# Patient Record
Sex: Female | Born: 1980 | Race: Black or African American | Hispanic: No | Marital: Single | Smoking: Never smoker
Health system: Southern US, Community
[De-identification: ages and names within clinical notes are randomized; demographics above are authoritative.]

## PROBLEM LIST (undated history)

## (undated) DIAGNOSIS — F419 Anxiety disorder, unspecified: Secondary | ICD-10-CM

## (undated) DIAGNOSIS — J189 Pneumonia, unspecified organism: Secondary | ICD-10-CM

## (undated) DIAGNOSIS — F32A Depression, unspecified: Secondary | ICD-10-CM

## (undated) DIAGNOSIS — E785 Hyperlipidemia, unspecified: Secondary | ICD-10-CM

## (undated) DIAGNOSIS — E119 Type 2 diabetes mellitus without complications: Secondary | ICD-10-CM

## (undated) DIAGNOSIS — I10 Essential (primary) hypertension: Secondary | ICD-10-CM

## (undated) DIAGNOSIS — F329 Major depressive disorder, single episode, unspecified: Secondary | ICD-10-CM

## (undated) HISTORY — DX: Depression, unspecified: F32.A

## (undated) HISTORY — DX: Type 2 diabetes mellitus without complications: E11.9

## (undated) HISTORY — DX: Hyperlipidemia, unspecified: E78.5

## (undated) HISTORY — PX: UMBILICAL HERNIA REPAIR: SHX196

## (undated) HISTORY — DX: Anxiety disorder, unspecified: F41.9

## (undated) HISTORY — DX: Essential (primary) hypertension: I10

## (undated) HISTORY — DX: Major depressive disorder, single episode, unspecified: F32.9

---

## 2014-09-02 ENCOUNTER — Encounter (HOSPITAL_COMMUNITY): Payer: Self-pay | Admitting: Emergency Medicine

## 2014-09-02 ENCOUNTER — Emergency Department (HOSPITAL_COMMUNITY)
Admission: EM | Admit: 2014-09-02 | Discharge: 2014-09-02 | Disposition: A | Discharge status: Home / Self Care | Payer: Medicaid Other | Attending: Emergency Medicine | Admitting: Emergency Medicine

## 2014-09-02 DIAGNOSIS — R51 Headache: Secondary | ICD-10-CM | POA: Diagnosis not present

## 2014-09-02 DIAGNOSIS — R42 Dizziness and giddiness: Secondary | ICD-10-CM | POA: Insufficient documentation

## 2014-09-02 DIAGNOSIS — R102 Pelvic and perineal pain: Secondary | ICD-10-CM | POA: Insufficient documentation

## 2014-09-02 DIAGNOSIS — Z3202 Encounter for pregnancy test, result negative: Secondary | ICD-10-CM | POA: Insufficient documentation

## 2014-09-02 DIAGNOSIS — R531 Weakness: Secondary | ICD-10-CM

## 2014-09-02 DIAGNOSIS — R110 Nausea: Secondary | ICD-10-CM | POA: Diagnosis not present

## 2014-09-02 DIAGNOSIS — R5383 Other fatigue: Secondary | ICD-10-CM | POA: Insufficient documentation

## 2014-09-02 DIAGNOSIS — Z880 Allergy status to penicillin: Secondary | ICD-10-CM | POA: Insufficient documentation

## 2014-09-02 LAB — COMPREHENSIVE METABOLIC PANEL
ALT: 14 U/L (ref 0–35)
AST: 16 U/L (ref 0–37)
Albumin: 3.8 g/dL (ref 3.5–5.2)
Alkaline Phosphatase: 74 U/L (ref 39–117)
Anion gap: 12 (ref 5–15)
BUN: 12 mg/dL (ref 6–23)
CALCIUM: 9.8 mg/dL (ref 8.4–10.5)
CHLORIDE: 99 meq/L (ref 96–112)
CO2: 27 mEq/L (ref 19–32)
Creatinine, Ser: 0.98 mg/dL (ref 0.50–1.10)
GFR calc Af Amer: 88 mL/min — ABNORMAL LOW (ref >90)
GFR calc non Af Amer: 76 mL/min — ABNORMAL LOW (ref >90)
Glucose, Bld: 133 mg/dL — ABNORMAL HIGH (ref 70–99)
Potassium: 3.5 mEq/L — ABNORMAL LOW (ref 3.7–5.3)
SODIUM: 138 meq/L (ref 137–147)
Total Protein: 7.8 g/dL (ref 6.0–8.3)

## 2014-09-02 LAB — URINALYSIS, ROUTINE W REFLEX MICROSCOPIC
Bilirubin Urine: NEGATIVE
Glucose, UA: NEGATIVE mg/dL
Hgb urine dipstick: NEGATIVE
Ketones, ur: NEGATIVE mg/dL
Leukocytes, UA: NEGATIVE
NITRITE: NEGATIVE
PH: 5.5 (ref 5.0–8.0)
Protein, ur: NEGATIVE mg/dL
SPECIFIC GRAVITY, URINE: 1.018 (ref 1.005–1.030)
UROBILINOGEN UA: 0.2 mg/dL (ref 0.0–1.0)

## 2014-09-02 LAB — CBC WITH DIFFERENTIAL/PLATELET
BASOS ABS: 0 10*3/uL (ref 0.0–0.1)
BASOS PCT: 0 % (ref 0–1)
Eosinophils Absolute: 0.1 10*3/uL (ref 0.0–0.7)
Eosinophils Relative: 1 % (ref 0–5)
HEMATOCRIT: 37.6 % (ref 36.0–46.0)
Hemoglobin: 12.1 g/dL (ref 12.0–15.0)
LYMPHS PCT: 34 % (ref 12–46)
Lymphs Abs: 2.5 10*3/uL (ref 0.7–4.0)
MCH: 28.2 pg (ref 26.0–34.0)
MCHC: 32.2 g/dL (ref 30.0–36.0)
MCV: 87.6 fL (ref 78.0–100.0)
MONO ABS: 0.4 10*3/uL (ref 0.1–1.0)
Monocytes Relative: 6 % (ref 3–12)
NEUTROS ABS: 4.4 10*3/uL (ref 1.7–7.7)
NEUTROS PCT: 59 % (ref 43–77)
PLATELETS: 297 10*3/uL (ref 150–400)
RBC: 4.29 MIL/uL (ref 3.87–5.11)
RDW: 12.6 % (ref 11.5–15.5)
WBC: 7.5 10*3/uL (ref 4.0–10.5)

## 2014-09-02 LAB — TSH: TSH: 1.94 u[IU]/mL (ref 0.350–4.500)

## 2014-09-02 LAB — POC URINE PREG, ED: Preg Test, Ur: NEGATIVE

## 2014-09-02 LAB — T4, FREE: Free T4: 1.11 ng/dL (ref 0.80–1.80)

## 2014-09-02 LAB — LIPASE, BLOOD: LIPASE: 27 U/L (ref 11–59)

## 2014-09-02 MED ORDER — POTASSIUM CHLORIDE CRYS ER 20 MEQ PO TBCR
40.0000 meq | EXTENDED_RELEASE_TABLET | Freq: Once | ORAL | Status: AC
  Administered 2014-09-02: 40 meq via ORAL
  Filled 2014-09-02: qty 2

## 2014-09-02 NOTE — ED Notes (Signed)
Pt states that she feels extremely fatigued. Went two months without a period and took several pregnancy tests which were negative.  States that her period just started 4 days ago but was having severe abd cramping.  Cramping has subsided.

## 2014-09-02 NOTE — Discharge Instructions (Signed)
Rest, drink plenty of fluids, make sure you eat healthy. Your thyroid function is pending, you will be called if it returns abnormal. Follow-up with primary care doctor for further evaluation and treatment of your symptoms. Return if worsening.   Fatigue Fatigue is a feeling of tiredness, lack of energy, lack of motivation, or feeling tired all the time. Having enough rest, good nutrition, and reducing stress will normally reduce fatigue. Consult your caregiver if it persists. The nature of your fatigue will help your caregiver to find out its cause. The treatment is based on the cause.  CAUSES  There are many causes for fatigue. Most of the time, fatigue can be traced to one or more of your habits or routines. Most causes fit into one or more of three general areas. They are: Lifestyle problems  Sleep disturbances.  Overwork.  Physical exertion.  Unhealthy habits.  Poor eating habits or eating disorders.  Alcohol and/or drug use .  Lack of proper nutrition (malnutrition). Psychological problems  Stress and/or anxiety problems.  Depression.  Grief.  Boredom. Medical Problems or Conditions  Anemia.  Pregnancy.  Thyroid gland problems.  Recovery from major surgery.  Continuous pain.  Emphysema or asthma that is not well controlled  Allergic conditions.  Diabetes.  Infections (such as mononucleosis).  Obesity.  Sleep disorders, such as sleep apnea.  Heart failure or other heart-related problems.  Cancer.  Kidney disease.  Liver disease.  Effects of certain medicines such as antihistamines, cough and cold remedies, prescription pain medicines, heart and blood pressure medicines, drugs used for treatment of cancer, and some antidepressants. SYMPTOMS  The symptoms of fatigue include:   Lack of energy.  Lack of drive (motivation).  Drowsiness.  Feeling of indifference to the surroundings. DIAGNOSIS  The details of how you feel help guide your  caregiver in finding out what is causing the fatigue. You will be asked about your present and past health condition. It is important to review all medicines that you take, including prescription and non-prescription items. A thorough exam will be done. You will be questioned about your feelings, habits, and normal lifestyle. Your caregiver may suggest blood tests, urine tests, or other tests to look for common medical causes of fatigue.  TREATMENT  Fatigue is treated by correcting the underlying cause. For example, if you have continuous pain or depression, treating these causes will improve how you feel. Similarly, adjusting the dose of certain medicines will help in reducing fatigue.  HOME CARE INSTRUCTIONS   Try to get the required amount of good sleep every night.  Eat a healthy and nutritious diet, and drink enough water throughout the day.  Practice ways of relaxing (including yoga or meditation).  Exercise regularly.  Make plans to change situations that cause stress. Act on those plans so that stresses decrease over time. Keep your work and personal routine reasonable.  Avoid street drugs and minimize use of alcohol.  Start taking a daily multivitamin after consulting your caregiver. SEEK MEDICAL CARE IF:   You have persistent tiredness, which cannot be accounted for.  You have fever.  You have unintentional weight loss.  You have headaches.  You have disturbed sleep throughout the night.  You are feeling sad.  You have constipation.  You have dry skin.  You have gained weight.  You are taking any new or different medicines that you suspect are causing fatigue.  You are unable to sleep at night.  You develop any unusual swelling of your legs  or other parts of your body. SEEK IMMEDIATE MEDICAL CARE IF:   You are feeling confused.  Your vision is blurred.  You feel faint or pass out.  You develop severe headache.  You develop severe abdominal, pelvic, or  back pain.  You develop chest pain, shortness of breath, or an irregular or fast heartbeat.  You are unable to pass a normal amount of urine.  You develop abnormal bleeding such as bleeding from the rectum or you vomit blood.  You have thoughts about harming yourself or committing suicide.  You are worried that you might harm someone else. MAKE SURE YOU:   Understand these instructions.  Will watch your condition.  Will get help right away if you are not doing well or get worse. Document Released: 08/06/2007 Document Revised: 01/01/2012 Document Reviewed: 02/10/2014 [facility] Patient Information 2015 [employer] [place]. This information is not intended to replace advice given to you by your health care provider. Make sure you discuss any questions you have with your health care provider.

## 2014-09-02 NOTE — ED Provider Notes (Signed)
CSN: [id number]     Arrival date & time 09/02/14  1519 History   First MD Initiated Contact with Patient 09/02/14 1636     Chief Complaint  Patient presents with  . Fatigue  . Abdominal Cramping     (Consider location/radiation/quality/duration/timing/severity/associated sxs/prior Treatment) HPI Anna Booth is a 33 y.o. female who presents to ED complaining of generalized weakness and fatigue. Patient states her symptoms began 2 months ago, but worsened in the last few days. She states she has no energy, she is emotional, nauseated. She states she skipped her period last month, and thought she may be pregnant so took 2 pregnancy tests at home both came back negative. She states she finally did have a menstrual period, which she just got off of yesterday. States it was shorter than normal, and states she has had much more cramping in the lower abdomen than she normally does with her periods. Patient states she has been under a lot of stressors lately, and states "I may just have too much going on in my life, but I know something is not right." She states she is eating and drinking well. She is getting plenty of sleep. She denies any urinary or vaginal complaints. No abdominal pain at this time. She does report drinking too much water, states she is always thirsty.patient also thought she may be anemic, so started taking iron supplements. She denies any upper respiratory symptoms, no vomiting, no fever or chills, no diarrhea.  History reviewed. No pertinent past medical history. History reviewed. No pertinent past surgical history. History reviewed. No pertinent family history. History  Substance Use Topics  . Smoking status: Never Smoker   . Smokeless tobacco: Not on file  . Alcohol Use: No   OB History    No data available     Review of Systems  Constitutional: Positive for fatigue. Negative for fever, chills and unexpected weight change.  HENT: Negative for congestion and sore  throat.   Respiratory: Negative for cough, chest tightness and shortness of breath.   Cardiovascular: Negative for chest pain, palpitations and leg swelling.  Gastrointestinal: Positive for nausea. Negative for vomiting, abdominal pain and diarrhea.  Genitourinary: Positive for pelvic pain. Negative for dysuria, flank pain, vaginal bleeding, vaginal discharge and vaginal pain.  Musculoskeletal: Negative for myalgias, arthralgias, neck pain and neck stiffness.  Skin: Negative for rash.  Neurological: Positive for dizziness, weakness, light-headedness and headaches.  All other systems reviewed and are negative.     Allergies  Penicillins  Home Medications   Prior to Admission medications   Not on File   BP 114/68 mmHg  Pulse 105  Temp(Src) 97.8 F (36.6 C) (Oral)  Resp 18  Ht 5\' 5"  (1.651 m)  Wt 265 lb (120.203 kg)  BMI 44.10 kg/m2  SpO2 99%  LMP 08/29/2014 Physical Exam  Constitutional: She is oriented to person, place, and time. She appears well-developed and well-nourished. No distress.  HENT:  Head: Normocephalic.  Eyes: Conjunctivae are normal.  Neck: Neck supple.  Cardiovascular: Normal rate, regular rhythm and normal heart sounds.   Pulmonary/Chest: Effort normal and breath sounds normal. No respiratory distress. She has no wheezes. She has no rales.  Abdominal: Soft. Bowel sounds are normal. She exhibits no distension. There is no tenderness. There is no rebound.  Musculoskeletal: She exhibits no edema.  Neurological: She is alert and oriented to person, place, and time.  Skin: Skin is warm and dry.  Psychiatric: She has a normal mood and affect.  Her behavior is normal.  Nursing note and vitals reviewed.   ED Course  Procedures (including critical care time) Labs Review Labs Reviewed  COMPREHENSIVE METABOLIC PANEL - Abnormal; Notable for the following:    Potassium 3.5 (*)    Glucose, Bld 133 (*)    Total Bilirubin <0.2 (*)    GFR calc non Af Amer 76 (*)     GFR calc Af Amer 88 (*)    All other components within normal limits  CBC WITH DIFFERENTIAL  LIPASE, BLOOD  URINALYSIS, ROUTINE W REFLEX MICROSCOPIC  TSH  T4, FREE  POC URINE PREG, ED    Imaging Review No results found.   EKG Interpretation None      MDM   Final diagnoses:  Weakness  Other fatigue    Patient with generalized weakness and fatigue. States felt like when she was pregnant. Urinalysis and urine pregnancies back, she is not pregnant based on the UA. She is mildly tachycardic at 101, otherwise normal vital signs. Her exam is unremarkable. Will get blood tests.   6:34 PM CBC metabolic panel unremarkable. Discussed results with patient. She will need further evaluation outpatient, will draw TSH and free T4. Patient is to follow-up  Filed Vitals:   09/02/14 1732  BP: 113/79  Pulse: 101  Temp: 98.7 F (37.1 C)  Resp: [address]18       Waqas Bruhl A Tachina Spoonemore, PA-C 09/02/14 1835  Rolland PorterMark James, MD 09/12/14 2308

## 2014-09-02 NOTE — Progress Notes (Signed)
   CARE MANAGEMENT ED NOTE 09/02/2014  Patient:  Anna Booth,Anna Booth   Account Number:  [id number]  Date Initiated:  09/02/2014  Documentation initiated by:  Radford PaxFERRERO,Lyndsey Demos  Subjective/Objective Assessment:   Patient presents to Ed with extreme fatigue and missed period for two months.     Subjective/Objective Assessment Detail:     Action/Plan:   Action/Plan Detail:   Anticipated DC Date:       Status Recommendation to Physician:   Result of Recommendation:    Other ED Services  Consult Working Plan    DC Planning Services  Other  PCP issues    Choice offered to / List presented to:            Status of service:  Completed, signed off  ED Comments:   ED Comments Detail:  EDCM spoke to patient at bedside. Patient confirms she [place] a [place]cp [place]. [facility]CM [facility], informed patient of services there and walk in times.  EDCM also provided patient with list of pcps who accept self pay patients, list of discount pharmacies and websites needymeds.org and GoodRX.com for medication assistance, phone number to inquire about the orange card, phone number to inquire about Mediciad, phone number to inquire about the Affordable Care Act, financial resources in the community such as local churches, [employer], urban ministries, and dental assistance for uninsured patients. Patient reports she will be receiving Mediciaid insurnace shortly.  [facility] provided patient with list of pcps ho accept [place]ace [place] as well. Patient thankfulf or resources.  No further EDCM needs at this time.

## 2014-09-11 ENCOUNTER — Emergency Department (HOSPITAL_COMMUNITY)
Admission: EM | Admit: 2014-09-11 | Discharge: 2014-09-11 | Disposition: A | Discharge status: Home / Self Care | Payer: Medicaid Other | Attending: Emergency Medicine | Admitting: Emergency Medicine

## 2014-09-11 ENCOUNTER — Encounter (HOSPITAL_COMMUNITY): Payer: Self-pay | Admitting: Emergency Medicine

## 2014-09-11 ENCOUNTER — Emergency Department (HOSPITAL_COMMUNITY): Payer: Medicaid Other

## 2014-09-11 DIAGNOSIS — R197 Diarrhea, unspecified: Secondary | ICD-10-CM | POA: Diagnosis not present

## 2014-09-11 DIAGNOSIS — R112 Nausea with vomiting, unspecified: Secondary | ICD-10-CM | POA: Insufficient documentation

## 2014-09-11 DIAGNOSIS — R05 Cough: Secondary | ICD-10-CM | POA: Diagnosis present

## 2014-09-11 DIAGNOSIS — J159 Unspecified bacterial pneumonia: Secondary | ICD-10-CM | POA: Insufficient documentation

## 2014-09-11 DIAGNOSIS — Z880 Allergy status to penicillin: Secondary | ICD-10-CM | POA: Insufficient documentation

## 2014-09-11 DIAGNOSIS — J189 Pneumonia, unspecified organism: Secondary | ICD-10-CM

## 2014-09-11 MED ORDER — IBUPROFEN 800 MG PO TABS
800.0000 mg | ORAL_TABLET | Freq: Once | ORAL | Status: AC
  Administered 2014-09-11: 800 mg via ORAL
  Filled 2014-09-11: qty 1

## 2014-09-11 MED ORDER — PROMETHAZINE HCL 25 MG PO TABS: 25.0000 mg | ORAL_TABLET | Freq: Four times a day (QID) | ORAL | Status: DC | PRN

## 2014-09-11 MED ORDER — IBUPROFEN 800 MG PO TABS
800.0000 mg | ORAL_TABLET | Freq: Three times a day (TID) | ORAL | Status: DC | PRN
Start: 2014-09-11 — End: 2015-08-26

## 2014-09-11 MED ORDER — AZITHROMYCIN 250 MG PO TABS: 250.0000 mg | ORAL_TABLET | Freq: Every day | ORAL | Status: DC

## 2014-09-11 MED ORDER — ONDANSETRON 4 MG PO TBDP
4.0000 mg | ORAL_TABLET | Freq: Once | ORAL | Status: AC
  Administered 2014-09-11: 4 mg via ORAL
  Filled 2014-09-11: qty 1

## 2014-09-11 NOTE — ED Notes (Signed)
Pt reports productive cough and sore throat for past several days. Pt began to have chest pain after having cough for a few days, worse with coughing. Pt has had emesis with coughing spells. Had diarrhea yesterday, but none today.

## 2014-09-11 NOTE — Progress Notes (Signed)
  CARE MANAGEMENT ED NOTE 09/11/2014  Patient:  Anna Booth,Anna Booth   Account Number:  [id number]  Date Initiated:  09/11/2014  Documentation initiated by:  Radford PaxFERRERO,Ashayla Subia  Subjective/Objective Assessment:   Patient presents to Ed with mulitple symptoms.     Subjective/Objective Assessment Detail:   chest xray: Airspace process over the lingula and possibly posterior left lower  lobe likely a pneumonia.     Action/Plan:   Action/Plan Detail:   Anticipated DC Date:  09/11/2014     Status Recommendation to Physician:   Result of Recommendation:    Other ED Services  Consult Working Plan    DC Planning Services  Other  PCP issues    Choice offered to / List presented to:            Status of service:  Completed, signed off  ED Comments:   ED Comments Detail:  EDCM spoke to patient at bedside for follow up.  EDCM has seen this patient in ED on 11/11.  Patient reports she had to refile for Mediciaid as, "They closed me out, so now I'm waitning again."  [facility] asked [facility] if she still has all of the information given to her from out previous meeting. Patient confirms that she does.  EDCM strongly encouraged patient to find a pcp in the community.  Patient reports she is waiting for her Medicaid to be approved before she finds a pcp.  EDCM explained to her the importance and purpose of having a pcp.  Patient verbalized understanding and reports will use prevoius resources to find a pcp.  No further EDCM needs at this time.

## 2014-09-11 NOTE — Discharge Instructions (Signed)
Diarrhea °Diarrhea is frequent loose and watery bowel movements. It can cause you to feel weak and dehydrated. Dehydration can cause you to become tired and thirsty, have a dry mouth, and have decreased urination that often is dark yellow. Diarrhea is a sign of another problem, most often an infection that will not last long. In most cases, diarrhea typically lasts 2-3 days. However, it can last longer if it is a sign of something more serious. It is important to treat your diarrhea as directed by your caregiver to lessen or prevent future episodes of diarrhea. °CAUSES  °Some common causes include: °· Gastrointestinal infections caused by viruses, bacteria, or parasites. °· Food poisoning or food allergies. °· Certain medicines, such as antibiotics, chemotherapy, and laxatives. °· Artificial sweeteners and fructose. °· Digestive disorders. °HOME CARE INSTRUCTIONS °· Ensure adequate fluid intake (hydration): Have 1 cup (8 oz) of fluid for each diarrhea episode. Avoid fluids that contain simple sugars or sports drinks, fruit juices, whole milk products, and sodas. Your urine should be clear or pale yellow if you are drinking enough fluids. Hydrate with an oral rehydration solution that you can purchase at pharmacies, retail stores, and online. You can prepare an oral rehydration solution at home by mixing the following ingredients together: °·  - tsp table salt. °· ¾ tsp baking soda. °·  tsp salt substitute containing potassium chloride. °· 1  tablespoons sugar. °· 1 L (34 oz) of water. °· Certain foods and beverages may increase the speed at which food moves through the gastrointestinal (GI) tract. These foods and beverages should be avoided and include: °· Caffeinated and alcoholic beverages. °· High-fiber foods, such as raw fruits and vegetables, nuts, seeds, and whole grain breads and cereals. °· Foods and beverages sweetened with sugar alcohols, such as xylitol, sorbitol, and mannitol. °· Some foods may be well  tolerated and may help thicken stool including: °· Starchy foods, such as rice, toast, pasta, low-sugar cereal, oatmeal, grits, baked potatoes, crackers, and bagels. °· Bananas. °· Applesauce. °· Add probiotic-rich foods to help increase healthy bacteria in the GI tract, such as yogurt and fermented milk products. °· Wash your hands well after each diarrhea episode. °· Only take over-the-counter or prescription medicines as directed by your caregiver. °· Take a warm bath to relieve any burning or pain from frequent diarrhea episodes. °SEEK IMMEDIATE MEDICAL CARE IF:  °· You are unable to keep fluids down. °· You have persistent vomiting. °· You have blood in your stool, or your stools are black and tarry. °· You do not urinate in 6-8 hours, or there is only a small amount of very dark urine. °· You have abdominal pain that increases or localizes. °· You have weakness, dizziness, confusion, or light-headedness. °· You have a severe headache. °· Your diarrhea gets worse or does not get better. °· You have a fever or persistent symptoms for more than 2-3 days. °· You have a fever and your symptoms suddenly get worse. °MAKE SURE YOU:  °· Understand these instructions. °· Will watch your condition. °· Will get help right away if you are not doing well or get worse. °Document Released: 09/29/2002 Document Revised: 02/23/2014 Document Reviewed: 06/16/2012 °ExitCare® Patient Information ©2015 [employer]. This information is not intended to replace advice given to you by your health care provider. Make sure you discuss any questions you have with your health care provider. ° °Nausea and Vomiting °Nausea is a sick feeling that often comes before throwing up (vomiting). Vomiting   is a reflex where stomach contents come out of your mouth. Vomiting can cause severe loss of body fluids (dehydration). Children and elderly adults can become dehydrated quickly, especially if they also have diarrhea. Nausea and vomiting are  symptoms of a condition or disease. It is important to find the cause of your symptoms. CAUSES   Direct irritation of the stomach lining. This irritation can result from increased acid production (gastroesophageal reflux disease), infection, food poisoning, taking certain medicines (such as nonsteroidal anti-inflammatory drugs), alcohol use, or tobacco use.  Signals from the brain.These signals could be caused by a headache, heat exposure, an inner ear disturbance, increased pressure in the brain from injury, infection, a tumor, or a concussion, pain, emotional stimulus, or metabolic problems.  An obstruction in the gastrointestinal tract (bowel obstruction).  Illnesses such as diabetes, hepatitis, gallbladder problems, appendicitis, kidney problems, cancer, sepsis, atypical symptoms of a heart attack, or eating disorders.  Medical treatments such as chemotherapy and radiation.  Receiving medicine that makes you sleep (general anesthetic) during surgery. DIAGNOSIS Your caregiver may ask for tests to be done if the problems do not improve after a few days. Tests may also be done if symptoms are severe or if the reason for the nausea and vomiting is not clear. Tests may include:  Urine tests.  Blood tests.  Stool tests.  Cultures (to look for evidence of infection).  X-rays or other imaging studies. Test results can help your caregiver make decisions about treatment or the need for additional tests. TREATMENT You need to stay well hydrated. Drink frequently but in small amounts.You may wish to drink water, sports drinks, clear broth, or eat frozen ice pops or gelatin dessert to help stay hydrated.When you eat, eating slowly may help prevent nausea.There are also some antinausea medicines that may help prevent nausea. HOME CARE INSTRUCTIONS   Take all medicine as directed by your caregiver.  If you do not have an appetite, do not force yourself to eat. However, you must continue to  drink fluids.  If you have an appetite, eat a normal diet unless your caregiver tells you differently.  Eat a variety of complex carbohydrates (rice, wheat, potatoes, bread), lean meats, yogurt, fruits, and vegetables.  Avoid high-fat foods because they are more difficult to digest.  Drink enough water and fluids to keep your urine clear or pale yellow.  If you are dehydrated, ask your caregiver for specific rehydration instructions. Signs of dehydration may include:  Severe thirst.  Dry lips and mouth.  Dizziness.  Dark urine.  Decreasing urine frequency and amount.  Confusion.  Rapid breathing or pulse. SEEK IMMEDIATE MEDICAL CARE IF:   You have blood or brown flecks (like coffee grounds) in your vomit.  You have black or bloody stools.  You have a severe headache or stiff neck.  You are confused.  You have severe abdominal pain.  You have chest pain or trouble breathing.  You do not urinate at least once every 8 hours.  You develop cold or clammy skin.  You continue to vomit for longer than 24 to 48 hours.  You have a fever. MAKE SURE YOU:   Understand these instructions.  Will watch your condition.  Will get help right away if you are not doing well or get worse. Document Released: 10/09/2005 Document Revised: 01/01/2012 Document Reviewed: 03/08/2011 [facility] Patient Information 2015 [place], [place]. This information is not intended to replace advice given to you by your health care provider. Make sure you discuss  any questions you have with your health care provider.     Pneumonia Pneumonia is an infection of the lungs.  CAUSES Pneumonia may be caused by bacteria or a virus. Usually, these infections are caused by breathing infectious particles into the lungs (respiratory tract). SIGNS AND SYMPTOMS   Cough.  Fever.  Chest pain.  Increased rate of breathing.  Wheezing.  Mucus production. DIAGNOSIS  If you have the common symptoms of  pneumonia, your health care provider will typically confirm the diagnosis with a chest X-ray. The X-ray will show an abnormality in the lung (pulmonary infiltrate) if you have pneumonia. Other tests of your blood, urine, or sputum may be done to find the specific cause of your pneumonia. Your health care provider may also do tests (blood gases or pulse oximetry) to see how well your lungs are working. TREATMENT  Some forms of pneumonia may be spread to other people when you cough or sneeze. You may be asked to wear a mask before and during your exam. Pneumonia that is caused by bacteria is treated with antibiotic medicine. Pneumonia that is caused by the influenza virus may be treated with an antiviral medicine. Most other viral infections must run their course. These infections will not respond to antibiotics.  HOME CARE INSTRUCTIONS   Cough suppressants may be used if you are losing too much rest. However, coughing protects you by clearing your lungs. You should avoid using cough suppressants if you can.  Your health care provider may have prescribed medicine if he or she thinks your pneumonia is caused by bacteria or influenza. Finish your medicine even if you start to feel better.  Your health care provider may also prescribe an expectorant. This loosens the mucus to be coughed up.  Take medicines only as directed by your health care provider.  Do not smoke. Smoking is a common cause of bronchitis and can contribute to pneumonia. If you are a smoker and continue to smoke, your cough may last several weeks after your pneumonia has cleared.  A cold steam vaporizer or humidifier in your room or home may help loosen mucus.  Coughing is often worse at night. Sleeping in a semi-upright position in a recliner or using a couple pillows under your head will help with this.  Get rest as you feel it is needed. Your body will usually let you know when you need to rest. PREVENTION A pneumococcal shot  (vaccine) is available to prevent a common bacterial cause of pneumonia. This is usually suggested for:  People over 32 years old.  Patients on chemotherapy.  People with chronic lung problems, such as bronchitis or emphysema.  People with immune system problems. If you are over 65 or have a high risk condition, you may receive the pneumococcal vaccine if you have not received it before. In some countries, a routine influenza vaccine is also recommended. This vaccine can help prevent some cases of pneumonia.You may be offered the influenza vaccine as part of your care. If you smoke, it is time to quit. You may receive instructions on how to stop smoking. Your health care provider can provide medicines and counseling to help you quit. SEEK MEDICAL CARE IF: You have a fever. SEEK IMMEDIATE MEDICAL CARE IF:   Your illness becomes worse. This is especially true if you are elderly or weakened from any other disease.  You cannot control your cough with suppressants and are losing sleep.  You begin coughing up blood.  You develop pain  which is getting worse or is uncontrolled with medicines.  Any of the symptoms which initially brought you in for treatment are getting worse rather than better.  You develop shortness of breath or chest pain. MAKE SURE YOU:   Understand these instructions.  Will watch your condition.  Will get help right away if you are not doing well or get worse. Document Released: 10/09/2005 Document Revised: 02/23/2014 Document Reviewed: 12/29/2010 [facility] Patient Information 2015 [place], [place]. This information is not intended to replace advice given to you by your health care provider. Make sure you discuss any questions you have with your health care provider.

## 2014-09-11 NOTE — ED Provider Notes (Signed)
TIME SEEN: 5:34 PM  CHIEF COMPLAINT: Subjective fevers, cough, vomiting diarrhea, myalgias  HPI: Pt is a 33 y.o. F with no significant past history presents emergency department with 2 days of subjective fevers, chills, productive cough with yellow/green sputum, vomiting and diarrhea, myalgias. She denies any sick contacts or recent travel. She has not had influenza vaccination. She has not smoker. She does have some chest pain with coughing only. No prior history of PE or DVT. No history of CAD.  ROS: See HPI Constitutional: fever  Eyes: no drainage  ENT: no runny nose   Cardiovascular:  chest pain with coughing Resp: no SOB  GI:  vomiting GU: no dysuria Integumentary: no rash  Allergy: no hives  Musculoskeletal: no leg swelling  Neurological: no slurred speech ROS otherwise negative  PAST MEDICAL HISTORY/PAST SURGICAL HISTORY:  History reviewed. No pertinent past medical history.  MEDICATIONS:  Prior to Admission medications   Not on File    ALLERGIES:  Allergies  Allergen Reactions  . Penicillins Hives    SOCIAL HISTORY:  History  Substance Use Topics  . Smoking status: Never Smoker   . Smokeless tobacco: Not on file  . Alcohol Use: No    FAMILY HISTORY: History reviewed. No pertinent family history.  EXAM: BP 120/74 mmHg  Pulse 107  Temp(Src) 99.3 F (37.4 C) (Oral)  Resp 16  SpO2 95%  LMP 08/29/2014 CONSTITUTIONAL: Alert and oriented and responds appropriately to questions. Well-appearing; well-nourished HEAD: Normocephalic EYES: Conjunctivae clear, PERRL ENT: normal nose; no rhinorrhea; moist mucous membranes; pharynx without lesions noted; mild bilateral maxillary sinus tenderness without erythema or warmth; no tonsillar hypertrophy or exudate NECK: Supple, no meningismus, no LAD  CARD: RRR; S1 and S2 appreciated; no murmurs, no clicks, no rubs, no gallops RESP: Normal chest excursion without splinting or tachypnea; breath sounds clear and equal  bilaterally; no wheezes, no rhonchi, no rales, no hypoxia or respiratory distress; no increased work of breathing ABD/GI: Normal bowel sounds; non-distended; soft, non-tender, no rebound, no guarding BACK:  The back appears normal and is non-tender to palpation, there is no CVA tenderness EXT: Normal ROM in all joints; non-tender to palpation; no edema; normal capillary refill; no cyanosis    SKIN: Normal color for age and race; warm NEURO: Moves all extremities equally PSYCH: The patient's mood and manner are appropriate. Grooming and personal hygiene are appropriate.  MEDICAL DECISION MAKING: Pt here with viral illness such as influenza versus pneumonia.  Will treat symptomatically with ibuprofen, Zofran. She is well-appearing, nontoxic. We'll obtain chest x-ray to rule out pneumonia. Anticipate discharge home.  ED PROGRESS: Chest x-ray shows a possible left lower lobe pneumonia. Will start on azithromycin. We'll discharge home with prescription for ibuprofen and Zofran as well. Discussed return precautions and supportive care instructions. She verbalized understanding and is comfortable with plan.     Layla MawKristen N Ward, DO 09/11/14 1819

## 2014-09-16 MED ORDER — DOXYCYCLINE HYCLATE 100 MG PO CAPS: 100.0000 mg | ORAL_CAPSULE | Freq: Two times a day (BID) | ORAL | Status: DC

## 2014-09-16 NOTE — ED Provider Notes (Signed)
Patient presented to the ED 5 days ago and was diagnosed with community-acquired pneumonia and was given azithromycin. Patient states she took 2 days worth of the antibiotic and had nausea and vomiting. She stopped taking the antibiotic. She is presenting today with no new symptoms or acute worsening. Patient reports intolerance to azithromycin and is requesting change of antibiotic. Patient allergic to penicillin and sulfa drugs. Will prescribe doxycycline twice a day 100mg  for 10 days. Patient to follow-up with any worsening of symptoms.  Anna SjogrenVictoria L Tamaiya Bump, PA-C 09/16/14 1529  Anna DibblesJon Knapp, MD 09/16/14 [id number]

## 2014-10-08 ENCOUNTER — Encounter (HOSPITAL_COMMUNITY): Payer: Self-pay | Admitting: *Deleted

## 2014-10-08 ENCOUNTER — Emergency Department (HOSPITAL_COMMUNITY): Payer: Medicaid Other

## 2014-10-08 ENCOUNTER — Emergency Department (HOSPITAL_COMMUNITY)
Admission: EM | Admit: 2014-10-08 | Discharge: 2014-10-08 | Disposition: A | Discharge status: Home / Self Care | Payer: Medicaid Other | Attending: Emergency Medicine | Admitting: Emergency Medicine

## 2014-10-08 DIAGNOSIS — M791 Myalgia: Secondary | ICD-10-CM | POA: Insufficient documentation

## 2014-10-08 DIAGNOSIS — Z880 Allergy status to penicillin: Secondary | ICD-10-CM | POA: Insufficient documentation

## 2014-10-08 DIAGNOSIS — J069 Acute upper respiratory infection, unspecified: Secondary | ICD-10-CM | POA: Diagnosis not present

## 2014-10-08 DIAGNOSIS — R531 Weakness: Secondary | ICD-10-CM | POA: Insufficient documentation

## 2014-10-08 DIAGNOSIS — B9789 Other viral agents as the cause of diseases classified elsewhere: Secondary | ICD-10-CM

## 2014-10-08 DIAGNOSIS — Z8701 Personal history of pneumonia (recurrent): Secondary | ICD-10-CM | POA: Insufficient documentation

## 2014-10-08 DIAGNOSIS — R0789 Other chest pain: Secondary | ICD-10-CM | POA: Diagnosis not present

## 2014-10-08 DIAGNOSIS — Z79899 Other long term (current) drug therapy: Secondary | ICD-10-CM | POA: Insufficient documentation

## 2014-10-08 DIAGNOSIS — J189 Pneumonia, unspecified organism: Secondary | ICD-10-CM

## 2014-10-08 DIAGNOSIS — R5383 Other fatigue: Secondary | ICD-10-CM | POA: Insufficient documentation

## 2014-10-08 DIAGNOSIS — R05 Cough: Secondary | ICD-10-CM | POA: Diagnosis present

## 2014-10-08 HISTORY — DX: Pneumonia, unspecified organism: J18.9

## 2014-10-08 LAB — I-STAT CHEM 8, ED
BUN: 18 mg/dL (ref 6–23)
CHLORIDE: 107 meq/L (ref 96–112)
Calcium, Ion: 1.21 mmol/L (ref 1.12–1.23)
Creatinine, Ser: 0.8 mg/dL (ref 0.50–1.10)
Glucose, Bld: 142 mg/dL — ABNORMAL HIGH (ref 70–99)
HCT: 39 % (ref 36.0–46.0)
Hemoglobin: 13.3 g/dL (ref 12.0–15.0)
POTASSIUM: 3.9 meq/L (ref 3.7–5.3)
SODIUM: 141 meq/L (ref 137–147)
TCO2: 19 mmol/L (ref 0–100)

## 2014-10-08 LAB — CBC WITH DIFFERENTIAL/PLATELET
BASOS ABS: 0 10*3/uL (ref 0.0–0.1)
BASOS PCT: 0 % (ref 0–1)
Eosinophils Absolute: 0.1 10*3/uL (ref 0.0–0.7)
Eosinophils Relative: 1 % (ref 0–5)
HCT: 36.9 % (ref 36.0–46.0)
Hemoglobin: 12.2 g/dL (ref 12.0–15.0)
Lymphocytes Relative: 40 % (ref 12–46)
Lymphs Abs: 3.8 10*3/uL (ref 0.7–4.0)
MCH: 28.9 pg (ref 26.0–34.0)
MCHC: 33.1 g/dL (ref 30.0–36.0)
MCV: 87.4 fL (ref 78.0–100.0)
Monocytes Absolute: 0.7 10*3/uL (ref 0.1–1.0)
Monocytes Relative: 7 % (ref 3–12)
NEUTROS ABS: 4.9 10*3/uL (ref 1.7–7.7)
Neutrophils Relative %: 52 % (ref 43–77)
PLATELETS: 293 10*3/uL (ref 150–400)
RBC: 4.22 MIL/uL (ref 3.87–5.11)
RDW: 13 % (ref 11.5–15.5)
WBC: 9.5 10*3/uL (ref 4.0–10.5)

## 2014-10-08 LAB — D-DIMER, QUANTITATIVE: D-Dimer, Quant: <0.27 ug/mL-FEU (ref 0.00–0.48)

## 2014-10-08 MED ORDER — HYDROCODONE-HOMATROPINE 5-1.5 MG/5ML PO SYRP: 5.0000 mL | ORAL_SOLUTION | Freq: Four times a day (QID) | ORAL | Status: DC | PRN

## 2014-10-08 MED ORDER — PREDNISONE 20 MG PO TABS: 40.0000 mg | ORAL_TABLET | Freq: Every day | ORAL | Status: DC

## 2014-10-08 MED ORDER — ALBUTEROL SULFATE HFA 108 (90 BASE) MCG/ACT IN AERS
2.0000 | INHALATION_SPRAY | Freq: Once | RESPIRATORY_TRACT | Status: AC
  Administered 2014-10-08: 2 via RESPIRATORY_TRACT
  Filled 2014-10-08: qty 6.7

## 2014-10-08 NOTE — ED Notes (Signed)
Pt states was diagnosed w/ pna about a month ago, finished antibiotics about 2 weeks ago, states still having back pain, "lung pain", states cold air makes her cough, states hasn't been able to wear a bra d/t it hurting under her breasts, states feels like someone is stabbing her in her back.

## 2014-10-08 NOTE — ED Provider Notes (Signed)
CSN: [id number]     Arrival date & time 10/08/14  2019 History   First MD Initiated Contact with Patient 10/08/14 2143     Chief Complaint  Patient presents with  . Pneumonia     (Consider location/radiation/quality/duration/timing/severity/associated sxs/prior Treatment) HPI Anna Booth is a 33 y.o. female with history of pneumonia 1 month ago, presents to ED with complaint of cough, chest tightness, shortness of breath. Patient states that she took antibiotics after being diagnosed with pneumonia, and felt better for about a week. States that her symptoms returned 2 weeks ago. States that her cough is a little different, it is nonproductive, comes and goes. Her main complaint is pain over bilateral chest, pain is worsened with deep breathing and coughing. States also that chest feels tight. States has had chills at home. Did not check her temperature. States that she is wearing for jackets, and still feels cold. She states she is weak, drained of energy. She states she has had to get a scooter in a grocery store because she has no energy to walk around. States she has been sleeping a lot. Patient states she is worried she may have pneumonia again. She has no history of asthma, she is not wheezing, no medications tried for these symptoms, she is not a smoker.  Past Medical History  Diagnosis Date  . PNA (pneumonia)    History reviewed. No pertinent past surgical history. No family history on file. History  Substance Use Topics  . Smoking status: Never Smoker   . Smokeless tobacco: Not on file  . Alcohol Use: No   OB History    No data available     Review of Systems  Constitutional: Positive for chills and fatigue. Negative for fever.  Respiratory: Positive for cough, chest tightness and shortness of breath.   Cardiovascular: Positive for chest pain. Negative for palpitations and leg swelling.  Gastrointestinal: Negative for nausea, vomiting, abdominal pain and diarrhea.   Genitourinary: Negative for dysuria, flank pain and pelvic pain.  Musculoskeletal: Positive for myalgias. Negative for arthralgias, neck pain and neck stiffness.  Skin: Negative for rash.  Neurological: Positive for weakness. Negative for dizziness and headaches.  All other systems reviewed and are negative.     Allergies  Penicillins and Sulfa antibiotics  Home Medications   Prior to Admission medications   Medication Sig Start Date End Date Taking? Authorizing Provider  promethazine (PHENERGAN) 25 MG tablet Take 1 tablet (25 mg total) by mouth every 6 (six) hours as needed for nausea or vomiting. 09/11/14  Yes Kristen N Ward, DO  azithromycin (ZITHROMAX) 250 MG tablet Take 1 tablet (250 mg total) by mouth daily. Take first 2 tablets together, then 1 every day until finished. Patient not taking: Reported on 10/08/2014 09/11/14   Layla MawKristen N Ward, DO  doxycycline (VIBRAMYCIN) 100 MG capsule Take 1 capsule (100 mg total) by mouth 2 (two) times daily. Patient not taking: Reported on 10/08/2014 09/16/14   Louann SjogrenVictoria L Creech, PA-C  ibuprofen (ADVIL,MOTRIN) 800 MG tablet Take 1 tablet (800 mg total) by mouth every 8 (eight) hours as needed for mild pain. 09/11/14   Kristen N Ward, DO   BP 120/83 mmHg  Pulse 107  Temp(Src) 98.7 F (37.1 C) (Oral)  Resp 20  Ht 5\' 6"  (1.676 m)  Wt 260 lb (117.935 kg)  BMI 41.99 kg/m2  SpO2 100%  LMP 07/26/2014 Physical Exam  Constitutional: She is oriented to person, place, and time. She appears well-developed and well-nourished. No  distress.  Eyes: Conjunctivae are normal.  Neck: Neck supple.  Cardiovascular: Normal rate, regular rhythm and normal heart sounds.   Pulmonary/Chest: Effort normal and breath sounds normal. No respiratory distress. She has no wheezes. She has no rales.  Musculoskeletal: She exhibits no edema.  Neurological: She is alert and oriented to person, place, and time.  Skin: Skin is warm and dry.  Nursing note and vitals  reviewed.   ED Course  Procedures (including critical care time) Labs Review Labs Reviewed  I-STAT CHEM 8, ED - Abnormal; Notable for the following:    Glucose, Bld 142 (*)    All other components within normal limits  CBC WITH DIFFERENTIAL  D-DIMER, QUANTITATIVE    Imaging Review Dg Chest 2 View  10/08/2014   CLINICAL DATA:  Pneumonia.  EXAM: CHEST  2 VIEW  COMPARISON:  September 11, 2014.  FINDINGS: The heart size and mediastinal contours are within normal limits. Both lungs are clear. No pneumothorax pleural effusion is noted. The visualized skeletal structures are unremarkable.  IMPRESSION: No acute cardiopulmonary abnormality seen. Left-sided opacity noted on prior exam has resolved.   Electronically Signed   By: Roque LiasJames  Green M.D.   On: 10/08/2014 21:24     EKG Interpretation None      MDM   Final diagnoses:  Viral URI with cough    Pt with pneumonia a month ago, here with worsening cough, body aches, chest tightness. Patient states she felt like the pneumonia was coming back. Labs were obtained, patient does not have elevation of white blood cell count, elevation neutrophils. Electrolytes unremarkable with slightly elevated glucose of 142. D dimer negative.  Chest x-ray is negative today with left-sided opacity that she had a month ago resolved. At this time her vital signs are normal, do not suspect she is septic or has any other major infection. Most likely viral infection. Feels slightly better with inhaler. Home with inhaler, prednisone given cough for several weeks, hycodan. Follow up with PCP.    Filed Vitals:   10/08/14 2106 10/08/14 2109 10/08/14 2315  BP:  120/83 117/83  Pulse:  107 84  Temp: 98.7 F (37.1 C) 98.7 F (37.1 C) 98.2 F (36.8 C)  TempSrc: Oral Oral Oral  Resp: 20 20 18   Height: 5\' 6"  (1.676 m)    Weight: 260 lb (117.935 kg)    SpO2:  100% 98%     Lottie Musselatyana A Crimson Beer, PA-C 10/09/14 0044  Tilden FossaElizabeth Rees, MD 10/09/14 [phone]

## 2014-10-08 NOTE — Discharge Instructions (Signed)
Inhaler, take 2 puffs every 4 hrs. Ibuprofen or tylenol for body aches and fever. Take prednisone as prescribed until all gone. Take hycodan for cough. Follow up with your doctor of symptoms not improving.    Cough, Adult  A cough is a reflex that helps clear your throat and airways. It can help heal the body or may be a reaction to an irritated airway. A cough may only last 2 or 3 weeks (acute) or may last more than 8 weeks (chronic).  CAUSES Acute cough:  Viral or bacterial infections. Chronic cough:  Infections.  Allergies.  Asthma.  Post-nasal drip.  Smoking.  Heartburn or acid reflux.  Some medicines.  Chronic lung problems (COPD).  Cancer. SYMPTOMS   Cough.  Fever.  Chest pain.  Increased breathing rate.  High-pitched whistling sound when breathing (wheezing).  Colored mucus that you cough up (sputum). TREATMENT   A bacterial cough may be treated with antibiotic medicine.  A viral cough must run its course and will not respond to antibiotics.  Your caregiver may recommend other treatments if you have a chronic cough. HOME CARE INSTRUCTIONS   Only take over-the-counter or prescription medicines for pain, discomfort, or fever as directed by your caregiver. Use cough suppressants only as directed by your caregiver.  Use a cold steam vaporizer or humidifier in your bedroom or home to help loosen secretions.  Sleep in a semi-upright position if your cough is worse at night.  Rest as needed.  Stop smoking if you smoke. SEEK IMMEDIATE MEDICAL CARE IF:   You have pus in your sputum.  Your cough starts to worsen.  You cannot control your cough with suppressants and are losing sleep.  You begin coughing up blood.  You have difficulty breathing.  You develop pain which is getting worse or is uncontrolled with medicine.  You have a fever. MAKE SURE YOU:   Understand these instructions.  Will watch your condition.  Will get help right away if  you are not doing well or get worse. Document Released: 04/07/2011 Document Revised: 01/01/2012 Document Reviewed: 04/07/2011 [facility] Patient Information 2015 [place], [place]. This information is not intended to replace advice given to you by your health care provider. Make sure you discuss any questions you have with your health care provider. Upper Respiratory Infection, Adult An upper respiratory infection (URI) is also sometimes known as the common cold. The upper respiratory tract includes the nose, sinuses, throat, trachea, and bronchi. Bronchi are the airways leading to the lungs. Most people improve within 1 week, but symptoms can last up to 2 weeks. A residual cough may last even longer.  CAUSES Many different viruses can infect the tissues lining the upper respiratory tract. The tissues become irritated and inflamed and often become very moist. Mucus production is also common. A cold is contagious. You can easily spread the virus to others by oral contact. This includes kissing, sharing a glass, coughing, or sneezing. Touching your mouth or nose and then touching a surface, which is then touched by another person, can also spread the virus. SYMPTOMS  Symptoms typically develop 1 to 3 days after you come in contact with a cold virus. Symptoms vary from person to person. They may include:  Runny nose.  Sneezing.  Nasal congestion.  Sinus irritation.  Sore throat.  Loss of voice (laryngitis).  Cough.  Fatigue.  Muscle aches.  Loss of appetite.  Headache.  Low-grade fever. DIAGNOSIS  You might diagnose your own cold based on familiar  symptoms, since most people get a cold 2 to 3 times a year. Your caregiver can confirm this based on your exam. Most importantly, your caregiver can check that your symptoms are not due to another disease such as strep throat, sinusitis, pneumonia, asthma, or epiglottitis. Blood tests, throat tests, and X-rays are not necessary to diagnose a  common cold, but they may sometimes be helpful in excluding other more serious diseases. Your caregiver will decide if any further tests are required. RISKS AND COMPLICATIONS  You may be at risk for a more severe case of the common cold if you smoke cigarettes, have chronic heart disease (such as heart failure) or lung disease (such as asthma), or if you have a weakened immune system. The very young and very old are also at risk for more serious infections. Bacterial sinusitis, middle ear infections, and bacterial pneumonia can complicate the common cold. The common cold can worsen asthma and chronic obstructive pulmonary disease (COPD). Sometimes, these complications can require emergency medical care and may be life-threatening. PREVENTION  The best way to protect against getting a cold is to practice good hygiene. Avoid oral or hand contact with people with cold symptoms. Wash your hands often if contact occurs. There is no clear evidence that vitamin C, vitamin E, echinacea, or exercise reduces the chance of developing a cold. However, it is always recommended to get plenty of rest and practice good nutrition. TREATMENT  Treatment is directed at relieving symptoms. There is no cure. Antibiotics are not effective, because the infection is caused by a virus, not by bacteria. Treatment may include:  Increased fluid intake. Sports drinks offer valuable electrolytes, sugars, and fluids.  Breathing heated mist or steam (vaporizer or shower).  Eating chicken soup or other clear broths, and maintaining good nutrition.  Getting plenty of rest.  Using gargles or lozenges for comfort.  Controlling fevers with ibuprofen or acetaminophen as directed by your caregiver.  Increasing usage of your inhaler if you have asthma. Zinc gel and zinc lozenges, taken in the first 24 hours of the common cold, can shorten the duration and lessen the severity of symptoms. Pain medicines may help with fever, muscle  aches, and throat pain. A variety of non-prescription medicines are available to treat congestion and runny nose. Your caregiver can make recommendations and may suggest nasal or lung inhalers for other symptoms.  HOME CARE INSTRUCTIONS   Only take over-the-counter or prescription medicines for pain, discomfort, or fever as directed by your caregiver.  Use a warm mist humidifier or inhale steam from a shower to increase air moisture. This may keep secretions moist and make it easier to breathe.  Drink enough water and fluids to keep your urine clear or pale yellow.  Rest as needed.  Return to work when your temperature has returned to normal or as your caregiver advises. You may need to stay home longer to avoid infecting others. You can also use a face mask and careful hand washing to prevent spread of the virus. SEEK MEDICAL CARE IF:   After the first few days, you feel you are getting worse rather than better.  You need your caregiver's advice about medicines to control symptoms.  You develop chills, worsening shortness of breath, or brown or red sputum. These may be signs of pneumonia.  You develop yellow or brown nasal discharge or pain in the face, especially when you bend forward. These may be signs of sinusitis.  You develop a fever, swollen neck glands,  pain with swallowing, or white areas in the back of your throat. These may be signs of strep throat. SEEK IMMEDIATE MEDICAL CARE IF:   You have a fever.  You develop severe or persistent headache, ear pain, sinus pain, or chest pain.  You develop wheezing, a prolonged cough, cough up blood, or have a change in your usual mucus (if you have chronic lung disease).  You develop sore muscles or a stiff neck. Document Released: 04/04/2001 Document Revised: 01/01/2012 Document Reviewed: 01/14/2014 [facility] Patient Information 2015 [place]. This information is not intended to replace advice given to you by your health care  provider. Make sure you discuss any questions you have with your health care provider.

## 2014-10-08 NOTE — ED Notes (Signed)
EKG given to EDP,Linker,MD., for review. 

## 2015-08-22 ENCOUNTER — Encounter (HOSPITAL_COMMUNITY): Payer: Self-pay

## 2015-08-22 ENCOUNTER — Emergency Department (HOSPITAL_COMMUNITY): Payer: Medicaid Other

## 2015-08-22 ENCOUNTER — Emergency Department (HOSPITAL_COMMUNITY)
Admission: EM | Admit: 2015-08-22 | Discharge: 2015-08-22 | Disposition: A | Discharge status: Home / Self Care | Payer: Medicaid Other | Attending: Emergency Medicine | Admitting: Emergency Medicine

## 2015-08-22 DIAGNOSIS — Z7952 Long term (current) use of systemic steroids: Secondary | ICD-10-CM | POA: Insufficient documentation

## 2015-08-22 DIAGNOSIS — R000 Tachycardia, unspecified: Secondary | ICD-10-CM | POA: Insufficient documentation

## 2015-08-22 DIAGNOSIS — J069 Acute upper respiratory infection, unspecified: Secondary | ICD-10-CM

## 2015-08-22 DIAGNOSIS — R112 Nausea with vomiting, unspecified: Secondary | ICD-10-CM | POA: Insufficient documentation

## 2015-08-22 DIAGNOSIS — B9789 Other viral agents as the cause of diseases classified elsewhere: Secondary | ICD-10-CM

## 2015-08-22 DIAGNOSIS — Z8701 Personal history of pneumonia (recurrent): Secondary | ICD-10-CM | POA: Insufficient documentation

## 2015-08-22 DIAGNOSIS — Z880 Allergy status to penicillin: Secondary | ICD-10-CM | POA: Insufficient documentation

## 2015-08-22 DIAGNOSIS — R05 Cough: Secondary | ICD-10-CM | POA: Diagnosis present

## 2015-08-22 MED ORDER — IBUPROFEN 800 MG PO TABS: 800.0000 mg | ORAL_TABLET | Freq: Once | ORAL | Status: DC

## 2015-08-22 MED ORDER — HYDROCODONE-HOMATROPINE 5-1.5 MG/5ML PO SYRP: 5.0000 mL | ORAL_SOLUTION | Freq: Once | ORAL | Status: DC

## 2015-08-22 MED ORDER — CETIRIZINE-PSEUDOEPHEDRINE ER 5-120 MG PO TB12: 1.0000 | ORAL_TABLET | Freq: Two times a day (BID) | ORAL | Status: DC

## 2015-08-22 MED ORDER — ONDANSETRON 4 MG PO TBDP: 4.0000 mg | ORAL_TABLET | Freq: Three times a day (TID) | ORAL | Status: DC | PRN

## 2015-08-22 MED ORDER — BENZONATATE 100 MG PO CAPS: 200.0000 mg | ORAL_CAPSULE | Freq: Two times a day (BID) | ORAL | Status: DC | PRN

## 2015-08-22 NOTE — ED Notes (Signed)
Patient c/o body aches, sore throat, sinus congestion, and a productive cough with clear sputum.

## 2015-08-22 NOTE — ED Notes (Signed)
Pt discharged to home. Dc instructions given. Prescriptions x 4 also given. No concerns voiced. Pt denied pain at time of discharge. Left unit ambulatory. Left in good condition. No concerns voiced. Vwilliams,rn.

## 2015-08-22 NOTE — Discharge Instructions (Signed)
Take your medications as prescribed. Please follow up with a primary care provider from the Resource Guide provided below in 4-5 days. Return to the emergency department if symptoms worsen or new onset of fever, difficulty breathing, chest pain, abdominal pain.

## 2015-08-22 NOTE — ED Provider Notes (Signed)
CSN: [id number]     Arrival date & time 08/22/15  [id number] History   First MD Initiated Contact with Patient 08/22/15 0919     Chief Complaint  Patient presents with  . Generalized Body Aches  . sinus drainage   . Cough     (Consider location/radiation/quality/duration/timing/severity/associated sxs/prior Treatment) HPI Comments: Patient is a 34 year old female who presents to the ED with complaint of body aches, onset 2 days. Patient reports that she started having a sore throat Friday morning and then began to have body aches, sinus congestion, productive cough with clear sputum, nausea, vomiting. Denies fever, chills, headache, SOB, CP, abdominal pain, urinary symptoms or diarrhea, numbness, tingling, weakness. She reports taking Tylenol at home with mild relief. She notes her daughter this past week has been home with cold-like symptoms in a sore throat. Patient reports being admitted in January for pneumonia.   Past Medical History  Diagnosis Date  . PNA (pneumonia)    History reviewed. No pertinent past surgical history. History reviewed. No pertinent family history. Social History  Substance Use Topics  . Smoking status: Never Smoker   . Smokeless tobacco: Never Used  . Alcohol Use: No   OB History    No data available     Review of Systems  HENT: Positive for congestion and sore throat.   Respiratory: Positive for cough.   Gastrointestinal: Positive for nausea and vomiting.  All other systems reviewed and are negative.     Allergies  Penicillins and Sulfa antibiotics  Home Medications   Prior to Admission medications   Medication Sig Start Date End Date Taking? Authorizing Provider  azithromycin (ZITHROMAX) 250 MG tablet Take 1 tablet (250 mg total) by mouth daily. Take first 2 tablets together, then 1 every day until finished. Patient not taking: Reported on 10/08/2014 09/11/14   Layla MawKristen N Ward, DO  doxycycline (VIBRAMYCIN) 100 MG capsule Take 1 capsule (100 mg  total) by mouth 2 (two) times daily. Patient not taking: Reported on 10/08/2014 09/16/14   Oswaldo ConroyVictoria Creech, PA-C  HYDROcodone-homatropine [facility](HYCODAN) 5-1.5 MG/5ML syrup Take 5 mLs by mouth every 6 (six) hours as needed for cough. 10/08/14   Tatyana Kirichenko, PA-C  ibuprofen (ADVIL,MOTRIN) 800 MG tablet Take 1 tablet (800 mg total) by mouth every 8 (eight) hours as needed for mild pain. 09/11/14   Kristen N Ward, DO  predniSONE (DELTASONE) 20 MG tablet Take 2 tablets (40 mg total) by mouth daily. 10/08/14   Tatyana Kirichenko, PA-C  promethazine (PHENERGAN) 25 MG tablet Take 1 tablet (25 mg total) by mouth every 6 (six) hours as needed for nausea or vomiting. 09/11/14   Kristen N Ward, DO   BP 117/69 mmHg  Pulse 118  Temp(Src) 98.7 F (37.1 C) (Oral)  Resp 16  Ht 5\' 5"  (1.651 m)  Wt 219 lb (99.338 kg)  BMI 36.44 kg/m2  SpO2 98%  LMP 07/23/2015 Physical Exam  Constitutional: She is oriented to person, place, and time. She appears well-developed and well-nourished. No distress.  HENT:  Head: Normocephalic and atraumatic.  Right Ear: Tympanic membrane normal.  Left Ear: Tympanic membrane normal.  Nose: Nose normal. No rhinorrhea. Right sinus exhibits no maxillary sinus tenderness and no frontal sinus tenderness. Left sinus exhibits no maxillary sinus tenderness and no frontal sinus tenderness.  Mouth/Throat: Uvula is midline, oropharynx is clear and moist and mucous membranes are normal. No oropharyngeal exudate, posterior oropharyngeal edema, posterior oropharyngeal erythema or tonsillar abscesses.  Eyes: Conjunctivae and EOM are normal. Pupils  are equal, round, and reactive to light. Right eye exhibits no discharge. Left eye exhibits no discharge. No scleral icterus.  Neck: Normal range of motion. Neck supple.  Cardiovascular: Regular rhythm, normal heart sounds and intact distal pulses.   tachycardic  Pulmonary/Chest: Effort normal and breath sounds normal. No respiratory distress. She  has no wheezes. She has no rales. She exhibits no tenderness.  Abdominal: Soft. Bowel sounds are normal. She exhibits no distension and no mass. There is no tenderness. There is no rebound and no guarding.  Musculoskeletal: She exhibits no edema.  Lymphadenopathy:    She has no cervical adenopathy.  Neurological: She is alert and oriented to person, place, and time.  Skin: Skin is warm and dry.  Nursing note and vitals reviewed.   ED Course  Procedures (including critical care time) Labs Review Labs Reviewed - No data to display  Imaging Review Dg Chest 2 View  08/22/2015  CLINICAL DATA:  Vomiting for 2 days, body aches, sore throat EXAM: CHEST  2 VIEW COMPARISON:  Radiograph 10/08/2014 new FINDINGS: Normal mediastinum and cardiac silhouette. Normal pulmonary vasculature. No evidence of effusion, infiltrate, or pneumothorax. No acute bony abnormality. IMPRESSION: No acute cardiopulmonary process. Electronically Signed   By: Genevive Bi M.D.   On: 08/22/2015 10:44   I have personally reviewed and evaluated these images and lab results as part of my medical decision-making.   MDM   Final diagnoses:  Viral URI with cough    Patient presents with body aches, sore throat, sinus congestion, cough, nausea, vomiting. Denies fever. Recent sick contacts include her daughter with a cold this week. Patient has been hospitalized in the past for pneumonia. Tachycardic, afebrile. Exam unremarkable, lungs CTAP, oral exam without erythema, exudate or swelling. Chest x-ray negative. I suspect patient's symptoms are likely due to viral URI.  I reexamined patient, her heart rate decreased to 98. Plan to discharge patient home.Advised patient to continue taking ibuprofen/Tylenol as needed for pain relief. Patient discharged with antitussive, antiemetic, decongestant. Patient given resource guide to follow up with primary care provider.  Evaluation does not show pathology requring ongoing emergent  intervention or admission. Pt is hemodynamically stable and mentating appropriately. Discussed findings/results and plan with patient/guardian, who agrees with plan. All questions answered. Return precautions discussed and outpatient follow up given.      Satira Sark [place], [place] 08/22/15 1208  Alvira Monday, MD 08/23/15 [phone]

## 2015-08-22 NOTE — ED Provider Notes (Signed)
  Physical Exam  BP 117/69 mmHg  Pulse 118  Temp(Src) 98.7 F (37.1 C) (Oral)  Resp 16  Ht 5\' 5"  (1.651 m)  Wt 219 lb (99.338 kg)  BMI 36.44 kg/m2  SpO2 98%  LMP 07/23/2015  Physical Exam  ED Course  Procedures  S: Patient presents for chills, body aches, productive white sputum cough, and sore throat that began 2 days ago. Treatment with 500 mg of Tylenol with minimal relief. Denies any fever, shortness of breath, chest pain, abdominal pain, nausea, vomiting, diarrhea.  O: Filed Vitals:   08/22/15 0855  BP: 117/69  Pulse: 118  Temp: 98.7 F (37.1 C)  Resp: 16  ENT: Oropharynx is without exudates, erythema, or edema. Uvula is midline and tonsils are without edema. Cardiac: Regular rate and rhythm without murmurs, gallops, or rubs. Respiratory: Lungs clear to auscultation bilaterally. No decreased breath sounds or wheezing. No respiratory distress.  A: Chest X-ray was ordered to r/o pneumonia. Ibuprofen and Hycodan was ordered. P: This is most likely viral upper respiratory infection. New provider to see patient.   Catha GosselinHanna Patel-Mills, PA-C 08/22/15 1339  Leta BaptistEmily Roe Nguyen, MD 08/22/15 [phone]

## 2015-08-26 ENCOUNTER — Emergency Department (HOSPITAL_COMMUNITY)
Admission: EM | Admit: 2015-08-26 | Discharge: 2015-08-26 | Disposition: A | Discharge status: Home / Self Care | Payer: Worker's Compensation | Attending: Emergency Medicine | Admitting: Emergency Medicine

## 2015-08-26 ENCOUNTER — Encounter (HOSPITAL_COMMUNITY): Payer: Self-pay | Admitting: Emergency Medicine

## 2015-08-26 DIAGNOSIS — Z880 Allergy status to penicillin: Secondary | ICD-10-CM | POA: Insufficient documentation

## 2015-08-26 DIAGNOSIS — Z8701 Personal history of pneumonia (recurrent): Secondary | ICD-10-CM | POA: Insufficient documentation

## 2015-08-26 DIAGNOSIS — M549 Dorsalgia, unspecified: Secondary | ICD-10-CM

## 2015-08-26 DIAGNOSIS — Z79899 Other long term (current) drug therapy: Secondary | ICD-10-CM | POA: Insufficient documentation

## 2015-08-26 DIAGNOSIS — R000 Tachycardia, unspecified: Secondary | ICD-10-CM | POA: Diagnosis not present

## 2015-08-26 DIAGNOSIS — Z7952 Long term (current) use of systemic steroids: Secondary | ICD-10-CM | POA: Insufficient documentation

## 2015-08-26 DIAGNOSIS — M546 Pain in thoracic spine: Secondary | ICD-10-CM | POA: Insufficient documentation

## 2015-08-26 DIAGNOSIS — M545 Low back pain: Secondary | ICD-10-CM | POA: Insufficient documentation

## 2015-08-26 MED ORDER — CYCLOBENZAPRINE HCL 10 MG PO TABS: 10.0000 mg | ORAL_TABLET | Freq: Two times a day (BID) | ORAL | Status: DC | PRN

## 2015-08-26 MED ORDER — IBUPROFEN 800 MG PO TABS: 800.0000 mg | ORAL_TABLET | Freq: Three times a day (TID) | ORAL | Status: DC

## 2015-08-26 NOTE — ED Provider Notes (Signed)
CSN: [id number]     Arrival date & time 08/26/15  2035 History  By signing my name below, I, Phillis HaggisGabriella Gaje, attest that this documentation has been prepared under the direction and in the presence of Elpidio AnisShari Nataliya Graig, PA-C. Electronically Signed: Phillis HaggisGabriella Gaje, ED Scribe. 08/26/2015. 8:45 PM.   Chief Complaint  Patient presents with  . Back Pain   The history is provided by the patient. No language interpreter was used.  HPI Comments: Anna Booth is a 34 y.o. female who presents to the Emergency Department complaining of gradually worsening, constant, shooting bilateral upper and lower back pain onset 1 day ago. Pt states that she was lifting a bed at work and her back pain started after. She denies neck pain, chest pain, leg pain, abdominal pain, numbness, or weakness. She reports allergies to penicillin.   Past Medical History  Diagnosis Date  . PNA (pneumonia)    History reviewed. No pertinent past surgical history. History reviewed. No pertinent family history. Social History  Substance Use Topics  . Smoking status: Never Smoker   . Smokeless tobacco: Never Used  . Alcohol Use: No   OB History    No data available     Review of Systems  Gastrointestinal: Negative for abdominal pain.  Musculoskeletal: Positive for back pain. Negative for arthralgias and neck pain.  Neurological: Negative for weakness and numbness.    Allergies  Penicillins and Sulfa antibiotics  Home Medications   Prior to Admission medications   Medication Sig Start Date End Date Taking? Authorizing Provider  azithromycin (ZITHROMAX) 250 MG tablet Take 1 tablet (250 mg total) by mouth daily. Take first 2 tablets together, then 1 every day until finished. Patient not taking: Reported on 10/08/2014 09/11/14   Kristen N Ward, DO  benzonatate (TESSALON) 100 MG capsule Take 2 capsules (200 mg total) by mouth 2 (two) times daily as needed for cough. 08/22/15   Barrett HenleNicole Elizabeth Nadeau, PA-C   cetirizine-pseudoephedrine (ZYRTEC-D) 5-120 MG tablet Take 1 tablet by mouth 2 (two) times daily. 08/22/15   Barrett HenleNicole Elizabeth Nadeau, PA-C  doxycycline (VIBRAMYCIN) 100 MG capsule Take 1 capsule (100 mg total) by mouth 2 (two) times daily. Patient not taking: Reported on 10/08/2014 09/16/14   Oswaldo ConroyVictoria Creech, PA-C  HYDROcodone-homatropine [facility](HYCODAN) 5-1.5 MG/5ML syrup Take 5 mLs by mouth every 6 (six) hours as needed for cough. 10/08/14   Tatyana Kirichenko, PA-C  ibuprofen (ADVIL,MOTRIN) 800 MG tablet Take 1 tablet (800 mg total) by mouth every 8 (eight) hours as needed for mild pain. 09/11/14   Kristen N Ward, DO  ondansetron (ZOFRAN ODT) 4 MG disintegrating tablet Take 1 tablet (4 mg total) by mouth every 8 (eight) hours as needed for nausea or vomiting. 08/22/15   Barrett HenleNicole Elizabeth Nadeau, PA-C  predniSONE (DELTASONE) 20 MG tablet Take 2 tablets (40 mg total) by mouth daily. 10/08/14   Tatyana Kirichenko, PA-C  promethazine (PHENERGAN) 25 MG tablet Take 1 tablet (25 mg total) by mouth every 6 (six) hours as needed for nausea or vomiting. 09/11/14   Kristen N Ward, DO   BP 116/88 mmHg  Pulse 110  Temp(Src) 98.7 F (37.1 C) (Oral)  Resp 20  Ht 5\' 5"  (1.651 m)  Wt 219 lb (99.338 kg)  BMI 36.44 kg/m2  SpO2 100%  LMP 07/23/2015 Physical Exam  Constitutional: She is oriented to person, place, and time. She appears well-developed and well-nourished.  HENT:  Head: Normocephalic and atraumatic.  Eyes: EOM are normal.  Neck: Normal range  of motion. Neck supple.  Cardiovascular: Normal rate.   Pulmonary/Chest: Effort normal. She exhibits no tenderness.  Abdominal: Soft. There is no tenderness.  Musculoskeletal: Normal range of motion.  Generalized bilateral upper and lower back tenderness; no cervical or paracervical tenderness; full ROM of upper and lower extremities; ambulatory  Neurological: She is alert and oriented to person, place, and time.  normal LE reflexes  Skin: Skin is warm and  dry.  Psychiatric: She has a normal mood and affect. Her behavior is normal.  Nursing note and vitals reviewed.   ED Course  Procedures (including critical care time) DIAGNOSTIC STUDIES: Oxygen Saturation is 100% on RA, normal by my interpretation.    COORDINATION OF CARE: 9:00 PM-Discussed treatment plan which includes work note, anti-inflammatories, and muscle relaxants with pt at bedside and pt agreed to plan.    Labs Review Labs Reviewed - No data to display  Imaging Review No results found. I have personally reviewed and evaluated these images and lab results as part of my medical decision-making.   EKG Interpretation None      MDM   Final diagnoses:  None    1. Muscular back pain 2. Tachycardia  The patient's recent visit reviewed. Tachycardia is persistent - not new. She was seen for URI symptoms and reports these have improved, almost resolved. No CP, SOB.  Exam and history c/w muscular back strain without neurologic deficit.   I personally performed the services described in this documentation, which was scribed in my presence. The recorded information has been reviewed and is accurate.     Elpidio Anis, PA-C 08/26/15 2110  Vanetta Mulders, MD 08/29/15 [phone]

## 2015-08-26 NOTE — ED Notes (Signed)
Patient states that she was lifting a bed the other day and hurt her back. The patient reports that she is having pain to her upper and lower back

## 2015-08-26 NOTE — ED Notes (Signed)
Patient given a lot of information on how to file for Workers Comp - the patient is aware that this needs to be taken care of ASAP.

## 2015-08-26 NOTE — Discharge Instructions (Signed)
FOLLOW UP WITH YOUR DOCTOR FOR FURTHER MANAGEMENT OF BACK PAIN IF NO BETTER IN 2-3 DAYS. YOU SHOULD ALSO TALK TO YOUR DOCTOR ABOUT A FAST HEART RATE AND HAVE THIS RECHECKED IN THE OFFICE.   Back Pain, Adult Back pain is very common in adults.The cause of back pain is rarely dangerous and the pain often gets better over time.The cause of your back pain may not be known. Some common causes of back pain include:  Strain of the muscles or ligaments supporting the spine.  Wear and tear (degeneration) of the spinal disks.  Arthritis.  Direct injury to the back. For many people, back pain may return. Since back pain is rarely dangerous, most people can learn to manage this condition on their own. HOME CARE INSTRUCTIONS Watch your back pain for any changes. The following actions may help to lessen any discomfort you are feeling:  Remain active. It is stressful on your back to sit or stand in one place for long periods of time. Do not sit, drive, or stand in one place for more than 30 minutes at a time. Take short walks on even surfaces as soon as you are able.Try to increase the length of time you walk each day.  Exercise regularly as directed by your health care provider. Exercise helps your back heal faster. It also helps avoid future injury by keeping your muscles strong and flexible.  Do not stay in bed.Resting more than 1-2 days can delay your recovery.  Pay attention to your body when you bend and lift. The most comfortable positions are those that put less stress on your recovering back. Always use proper lifting techniques, including:  Bending your knees.  Keeping the load close to your body.  Avoiding twisting.  Find a comfortable position to sleep. Use a firm mattress and lie on your side with your knees slightly bent. If you lie on your back, put a pillow under your knees.  Avoid feeling anxious or stressed.Stress increases muscle tension and can worsen back pain.It is  important to recognize when you are anxious or stressed and learn ways to manage it, such as with exercise.  Take medicines only as directed by your health care provider. Over-the-counter medicines to reduce pain and inflammation are often the most helpful.Your health care provider may prescribe muscle relaxant drugs.These medicines help dull your pain so you can more quickly return to your normal activities and healthy exercise.  Apply ice to the injured area:  Put ice in a plastic bag.  Place a towel between your skin and the bag.  Leave the ice on for 20 minutes, 2-3 times a day for the first 2-3 days. After that, ice and heat may be alternated to reduce pain and spasms.  Maintain a healthy weight. Excess weight puts extra stress on your back and makes it difficult to maintain good posture. SEEK MEDICAL CARE IF:  You have pain that is not relieved with rest or medicine.  You have increasing pain going down into the legs or buttocks.  You have pain that does not improve in one week.  You have night pain.  You lose weight.  You have a fever or chills. SEEK IMMEDIATE MEDICAL CARE IF:   You develop new bowel or bladder control problems.  You have unusual weakness or numbness in your arms or legs.  You develop nausea or vomiting.  You develop abdominal pain.  You feel faint.   This information is not intended to replace advice  given to you by your health care provider. Make sure you discuss any questions you have with your health care provider.   Document Released: 10/09/2005 Document Revised: 10/30/2014 Document Reviewed: 02/10/2014 Elsevier Interactive Patient Education 2016 [place] Injury Prevention Back injuries can be very painful. They can also be difficult to heal. After having one back injury, you are more likely to injure your back again. It is important to learn how to avoid injuring or re-injuring your back. The following tips can help you to  prevent a back injury. WHAT SHOULD I KNOW ABOUT PHYSICAL FITNESS?  Exercise for 30 minutes per day on most days of the week or as directed by your health care provider. Make sure to:  Do aerobic exercises, such as walking, jogging, biking, or swimming.  Do exercises that increase balance and strength, such as tai chi and yoga. These can decrease your risk of falling and injuring your back.  Do stretching exercises to help with flexibility.  Try to develop strong abdominal muscles. Your abdominal muscles provide a lot of the support that is needed by your back.  Maintain a healthy weight. This helps to decrease your risk of a back injury. WHAT SHOULD I KNOW ABOUT MY DIET?  Talk with your health care provider about your overall diet. Take supplements and vitamins only as directed by your health care provider.  Talk with your health care provider about how much calcium and vitamin D you need each day. These nutrients help to prevent weakening of the bones (osteoporosis). Osteoporosis can cause broken (fractured) bones, which lead to back pain.  Include good sources of calcium in your diet, such as dairy products, green leafy vegetables, and products that have had calcium added to them (fortified).  Include good sources of vitamin D in your diet, such as milk and foods that are fortified with vitamin D. WHAT SHOULD I KNOW ABOUT MY POSTURE?  Sit up straight and stand up straight. Avoid leaning forward when you sit or hunching over when you stand.  Choose chairs that have good low-back (lumbar) support.  If you work at a desk, sit close to it so you do not need to lean over. Keep your chin tucked in. Keep your neck drawn back, and keep your elbows bent at a right angle. Your arms should look like the letter "L."  Sit high and close to the steering wheel when you drive. Add a lumbar support to your car seat, if needed.  Avoid sitting or standing in one position for very long. Take breaks  to get up, stretch, and walk around at least one time every hour. Take breaks every hour if you are driving for long periods of time.  Sleep on your side with your knees slightly bent, or sleep on your back with a pillow under your knees. Do not lie on the front of your body to sleep. WHAT SHOULD I KNOW ABOUT LIFTING, TWISTING, AND REACHING? Lifting and Heavy Lifting  Avoid heavy lifting, especially repetitive heavy lifting. If you must do heavy lifting:  Stretch before lifting.  Work slowly.  Rest between lifts.  Use a tool such as a cart or a dolly to move objects if one is available.  Make several small trips instead of carrying one heavy load.  Ask for help when you need it, especially when moving big objects.  Follow these steps when lifting:  Stand with your feet shoulder-width apart.  Get as close to the object as  you can. Do not try to pick up a heavy object that is far from your body.  Use handles or lifting straps if they are available.  Bend at your knees. Squat down, but keep your heels off the floor.  Keep your shoulders pulled back, your chin tucked in, and your back straight.  Lift the object slowly while you tighten the muscles in your legs, abdomen, and buttocks. Keep the object as close to the center of your body as possible.  Follow these steps when putting down a heavy load:  Stand with your feet shoulder-width apart.  Lower the object slowly while you tighten the muscles in your legs, abdomen, and buttocks. Keep the object as close to the center of your body as possible.  Keep your shoulders pulled back, your chin tucked in, and your back straight.  Bend at your knees. Squat down, but keep your heels off the floor.  Use handles or lifting straps if they are available. Twisting and Reaching  Avoid lifting heavy objects above your waist.  Do not twist at your waist while you are lifting or carrying a load. If you need to turn, move your feet.  Do  not bend over without bending at your knees.  Avoid reaching over your head, across a table, or for an object on a high surface. WHAT ARE SOME OTHER TIPS?  Avoid wet floors and icy ground. Keep sidewalks clear of ice to prevent falls.  Do not sleep on a mattress that is too soft or too hard.  Keep items that are used frequently within easy reach.  Put heavier objects on shelves at waist level, and put lighter objects on lower or higher shelves.  Find ways to decrease your stress, such as exercise, massage, or relaxation techniques. Stress can build up in your muscles. Tense muscles are more vulnerable to injury.  Talk with your health care provider if you feel anxious or depressed. These conditions can make back pain worse.  Wear flat heel shoes with cushioned soles.  Avoid sudden movements.  Use both shoulder straps when carrying a backpack.  Do not use any tobacco products, including cigarettes, chewing tobacco, or electronic cigarettes. If you need help quitting, ask your health care provider.   This information is not intended to replace advice given to you by your health care provider. Make sure you discuss any questions you have with your health care provider.   Document Released: 11/16/2004 Document Revised: 02/23/2015 Document Reviewed: 10/13/2014 Elsevier Interactive Patient Education 2016 [employer] Nonspecific Tachycardia Tachycardia is a faster than normal heartbeat (more than 100 beats per minute). In adults, the heart normally beats between 60 and 100 times a minute. A fast heartbeat may be a normal response to exercise or stress. It does not necessarily mean that something is wrong. However, sometimes when your heart beats too fast it may not be able to pump enough blood to the rest of your body. This can result in chest pain, shortness of breath, dizziness, and even fainting. Nonspecific tachycardia means that the specific cause or pattern of your tachycardia is  unknown. CAUSES  Tachycardia may be harmless or it may be due to a more serious underlying cause. Possible causes of tachycardia include:  Exercise or exertion.  Fever.  Pain or injury.  Infection.  Loss of body fluids (dehydration).  Overactive thyroid.  Lack of red blood cells (anemia).  Anxiety and stress.  Alcohol.  Caffeine.  Tobacco products.  Diet pills.  Illegal  drugs.  Heart disease. SYMPTOMS  Rapid or irregular heartbeat (palpitations).  Suddenly feeling your heart beating (cardiac awareness).  Dizziness.  Tiredness (fatigue).  Shortness of breath.  Chest pain.  Nausea.  Fainting. DIAGNOSIS  Your caregiver will perform a physical exam and take your medical history. In some cases, a heart specialist (cardiologist) may be consulted. Your caregiver may also order:  Blood tests.  Electrocardiography. This test records the electrical activity of your heart.  A heart monitoring test. TREATMENT  Treatment will depend on the likely cause of your tachycardia. The goal is to treat the underlying cause of your tachycardia. Treatment methods may include:  Replacement of fluids or blood through an intravenous (IV) tube for moderate to severe dehydration or anemia.  New medicines or changes in your current medicines.  Diet and lifestyle changes.  Treatment for certain infections.  Stress relief or relaxation methods. HOME CARE INSTRUCTIONS   Rest.  Drink enough fluids to keep your urine clear or pale yellow.  Do not smoke.  Avoid:  Caffeine.  Tobacco.  Alcohol.  Chocolate.  Stimulants such as over-the-counter diet pills or pills that help you stay awake.  Situations that cause anxiety or stress.  Illegal drugs such as marijuana, phencyclidine (PCP), and cocaine.  Only take medicine as directed by your caregiver.  Keep all follow-up appointments as directed by your caregiver. SEEK IMMEDIATE MEDICAL CARE IF:   You have pain in  your chest, upper arms, jaw, or neck.  You become weak, dizzy, or feel faint.  You have palpitations that will not go away.  You vomit, have diarrhea, or pass blood in your stool.  Your skin is cool, pale, and wet.  You have a fever that will not go away with rest, fluids, and medicine. MAKE SURE YOU:   Understand these instructions.  Will watch your condition.  Will get help right away if you are not doing well or get worse.   This information is not intended to replace advice given to you by your health care provider. Make sure you discuss any questions you have with your health care provider.   Document Released: 11/16/2004 Document Revised: 01/01/2012 Document Reviewed: 04/23/2015 Elsevier Interactive Patient Education 2016 [place] therapy can help ease sore, stiff, injured, and tight muscles and joints. Heat relaxes your muscles, which may help ease your pain.  RISKS AND COMPLICATIONS If you have any of the following conditions, do not use heat therapy unless your health care provider has approved:  Poor circulation.  Healing wounds or scarred skin in the area being treated.  Diabetes, heart disease, or high blood pressure.  Not being able to feel (numbness) the area being treated.  Unusual swelling of the area being treated.  Active infections.  Blood clots.  Cancer.  Inability to communicate pain. This may include young children and people who have problems with their brain function (dementia).  Pregnancy. Heat therapy should only be used on old, pre-existing, or long-lasting (chronic) injuries. Do not use heat therapy on new injuries unless directed by your health care provider. HOW TO USE HEAT THERAPY There are several different kinds of heat therapy, including:  Moist heat pack.  Warm water bath.  Hot water bottle.  Electric heating pad.  Heated gel pack.  Heated wrap.  Electric heating pad. Use the heat therapy method  suggested by your health care provider. Follow your health care provider's instructions on when and how to use heat therapy. GENERAL HEAT THERAPY  RECOMMENDATIONS  Do not sleep while using heat therapy. Only use heat therapy while you are awake.  Your skin may turn pink while using heat therapy. Do not use heat therapy if your skin turns red.  Do not use heat therapy if you have new pain.  High heat or long exposure to heat can cause burns. Be careful when using heat therapy to avoid burning your skin.  Do not use heat therapy on areas of your skin that are already irritated, such as with a rash or sunburn. SEEK MEDICAL CARE IF:  You have blisters, redness, swelling, or numbness.  You have new pain.  Your pain is worse. MAKE SURE YOU:  Understand these instructions.  Will watch your condition.  Will get help right away if you are not doing well or get worse.   This information is not intended to replace advice given to you by your health care provider. Make sure you discuss any questions you have with your health care provider.   Document Released: 01/01/2012 Document Revised: 10/30/2014 Document Reviewed: 12/02/2013 Elsevier Interactive Patient Education [employer].

## 2015-08-26 NOTE — ED Notes (Signed)
Patient reports that she now wants to find out how to file this under workers comp.

## 2015-08-30 ENCOUNTER — Encounter (HOSPITAL_COMMUNITY): Payer: Self-pay | Admitting: Family Medicine

## 2015-08-30 ENCOUNTER — Emergency Department (HOSPITAL_COMMUNITY)
Admission: EM | Admit: 2015-08-30 | Discharge: 2015-08-30 | Disposition: A | Discharge status: Home / Self Care | Payer: Worker's Compensation | Attending: Emergency Medicine | Admitting: Emergency Medicine

## 2015-08-30 ENCOUNTER — Emergency Department (HOSPITAL_COMMUNITY)
Admission: EM | Admit: 2015-08-30 | Discharge: 2015-08-30 | Discharge status: Against Medical Advice | Payer: Medicaid Other | Attending: Emergency Medicine | Admitting: Emergency Medicine

## 2015-08-30 DIAGNOSIS — M546 Pain in thoracic spine: Secondary | ICD-10-CM | POA: Diagnosis not present

## 2015-08-30 DIAGNOSIS — Z8701 Personal history of pneumonia (recurrent): Secondary | ICD-10-CM | POA: Insufficient documentation

## 2015-08-30 DIAGNOSIS — Z0289 Encounter for other administrative examinations: Secondary | ICD-10-CM | POA: Diagnosis not present

## 2015-08-30 DIAGNOSIS — Z79899 Other long term (current) drug therapy: Secondary | ICD-10-CM | POA: Insufficient documentation

## 2015-08-30 DIAGNOSIS — Z880 Allergy status to penicillin: Secondary | ICD-10-CM | POA: Diagnosis not present

## 2015-08-30 DIAGNOSIS — M62830 Muscle spasm of back: Secondary | ICD-10-CM | POA: Diagnosis not present

## 2015-08-30 DIAGNOSIS — R000 Tachycardia, unspecified: Secondary | ICD-10-CM | POA: Insufficient documentation

## 2015-08-30 DIAGNOSIS — Z7952 Long term (current) use of systemic steroids: Secondary | ICD-10-CM | POA: Diagnosis not present

## 2015-08-30 DIAGNOSIS — M545 Low back pain: Secondary | ICD-10-CM | POA: Diagnosis present

## 2015-08-30 DIAGNOSIS — M7918 Myalgia, other site: Secondary | ICD-10-CM

## 2015-08-30 MED ORDER — METHOCARBAMOL 500 MG PO TABS: 1000.0000 mg | ORAL_TABLET | Freq: Four times a day (QID) | ORAL | Status: DC | PRN

## 2015-08-30 NOTE — ED Notes (Signed)
Pt here for drug screen. sts workers comp claim due to back pain while on the job.

## 2015-08-30 NOTE — ED Notes (Signed)
Pt went to [facility] for her UDS.  No medical complaint at this time.

## 2015-08-30 NOTE — ED Notes (Signed)
Urine drug screen done, and sample sent to lab.

## 2015-08-30 NOTE — ED Notes (Signed)
Patient states she took flexiril prior to arrival, approximately 1 hour before she came here.

## 2015-08-30 NOTE — ED Notes (Signed)
Pt states that she was involved in a workers comp case on Wednesday.  Pt states her boss called her and said she has to come right now and get a UDS.

## 2015-08-30 NOTE — Discharge Instructions (Signed)
For pain control you may take up to  of Motrin (also known as ibuprofen). That is usually 4 over the counter pills,  3 times a day. Take with food to minimize stomach irritation   For breakthrough pain you may take Robaxin. Do not drink alcohol, drive or operate heavy machinery when taking Robaxin.  Do not hesitate to return to the emergency room for any new, worsening or concerning symptoms.  Please obtain primary care using resource guide below. Let them know that you were seen in the emergency room and that they will need to obtain records for further outpatient management.   Musculoskeletal Pain Musculoskeletal pain is muscle and boney aches and pains. These pains can occur in any part of the body. Your caregiver may treat you without knowing the cause of the pain. They may treat you if blood or urine tests, X-rays, and other tests were normal.  CAUSES There is often not a definite cause or reason for these pains. These pains may be caused by a type of germ (virus). The discomfort may also come from overuse. Overuse includes working out too hard when your body is not fit. Boney aches also come from weather changes. Bone is sensitive to atmospheric pressure changes. HOME CARE INSTRUCTIONS   Ask when your test results will be ready. Make sure you get your test results.  Only take over-the-counter or prescription medicines for pain, discomfort, or fever as directed by your caregiver. If you were given medications for your condition, do not drive, operate machinery or power tools, or sign legal documents for 24 hours. Do not drink alcohol. Do not take sleeping pills or other medications that may interfere with treatment.  Continue all activities unless the activities cause more pain. When the pain lessens, slowly resume normal activities. Gradually increase the intensity and duration of the activities or exercise.  During periods of severe pain, bed rest may be helpful. Lay or sit in any  position that is comfortable.  Putting ice on the injured area.  Put ice in a bag.  Place a towel between your skin and the bag.  Leave the ice on for 15 to 20 minutes, 3 to 4 times a day.  Follow up with your caregiver for continued problems and no reason can be found for the pain. If the pain becomes worse or does not go away, it may be necessary to repeat tests or do additional testing. Your caregiver may need to look further for a possible cause. SEEK IMMEDIATE MEDICAL CARE IF:  You have pain that is getting worse and is not relieved by medications.  You develop chest pain that is associated with shortness or breath, sweating, feeling sick to your stomach (nauseous), or throw up (vomit).  Your pain becomes localized to the abdomen.  You develop any new symptoms that seem different or that concern you. MAKE SURE YOU:   Understand these instructions.  Will watch your condition.  Will get help right away if you are not doing well or get worse.   This information is not intended to replace advice given to you by your health care provider. Make sure you discuss any questions you have with your health care provider.   Document Released: 10/09/2005 Document Revised: 01/01/2012 Document Reviewed: 06/13/2013 Elsevier Interactive Patient Education 2016 [employer].   Emergency Department Resource Guide 1) Find a Doctor and Pay Out of Pocket Although you won't have to find out who is covered by your insurance plan, it is  a good idea to ask around and get recommendations. You will then need to call the office and see if the doctor you have chosen will accept you as a new patient and what types of options they offer for patients who are self-pay. Some doctors offer discounts or will set up payment plans for their patients who do not have insurance, but you will need to ask so you aren't surprised when you get to your appointment.  2) Contact Your Local Health Department Not all health  departments have doctors that can see patients for sick visits, but many do, so it is worth a call to see if yours does. If you don't know where your local health department is, you can check in your phone book. The CDC also has a tool to help you locate your state's health department, and many state websites also have listings of all of their local health departments.  3) Find a Walk-in Clinic If your illness is not likely to be very severe or complicated, you may want to try a walk in clinic. These are popping up all over the country in pharmacies, drugstores, and shopping centers. They're usually staffed by nurse practitioners or physician assistants that have been trained to treat common illnesses and complaints. They're usually fairly quick and inexpensive. However, if you have serious medical issues or chronic medical problems, these are probably not your best option.  No Primary Care Doctor: - Call Health Connect at  [phone] - they can help you locate a primary care doctor that  accepts your insurance, provides certain services, etc. - Physician Referral Service- [phone]  Chronic Pain Problems: Organization         Address  Phone   Notes  [facility]  [phone] Patients need to be referred by their primary care doctor.   Medication Assistance: Organization         Address  Phone   Notes  [facility] [address] [place], [place] [address] [phone] --Must be a resident of [place] -- Must have NO insurance coverage whatsoever (no Medicaid/ Medicare, etc.) -- The pt. MUST have a primary care doctor that directs their care regularly and follows them in the community   MedAssist  [phone]   [employer]  [phone]    Agencies that provide inexpensive medical care: Organization         Address  Phone   Notes  [facility]  [phone]   [facility]     [phone]   [facility] [address] [place], [place] [address] [phone]   [facility] [address], [place] [phone]   [facility]anned [facility]    [phone]   [facility]    [phone]   [facility]  [address], [place] Phone:  [phone], Fax:  [phone] Hours of Operation:  9 am - 6 pm, M-F.  Also accepts Medicaid/Medicare and self-pay.  [facility]  [address], [place] Phone: [phone], Fax: [phone]. Hours of Operation:  8:30 am - 5:30 pm, M-F.  Also accepts Medicaid and self-pay.  [facility] [place] [address], [place] Phone: [phone]   [facility] [address], [place], [place] [phone], Ext. [phone] Mondays & Thursdays: 7-9 AM.  First 15 patients are seen on a first come, first serve basis.    Luis Lopez Providers:  Organization         Address  Phone   Notes  [facility] [address], [place] [phone] Also accepts self-pay patients.  [facility] [address], [place]  [phone]   [place], [address], [place] [phone]   [facility] [address] [place] [phone]   Lucianne Lei [address], [place]   [phone] Only accepts [place] Access [place] patients after they have their name applied to their card.   Self-Pay (no insurance) in [facility]:  Organization         Address  Phone   Notes  Sickle Cell Patients, [facility] [place] [phone]   [facility] Urgent Care Portal [phone]   [facility] [place]  [place], [place], [place] [phone]     [facility]/Dr. Osei-Bonsu  [address] [place] or [address], [place] [phone] Phone number for both [place] and [place] locations is the same.  [facility]gent Medical and [facility] [address] [place] [phone]   [facility] [address] [place] or [address] [phone]   [facility] [address] [place] [phone], phone; [phone], fax Sees patients 1st and 3rd Saturday of every month.  Must not qualify for public or private insurance (i.e. Medicaid, Medicare, [place] Health Choice, Veterans' Benefits)  Household income should be no more than 200% of the poverty level The clinic cannot treat you if you are pregnant or think you are pregnant  Sexually transmitted diseases are not treated at the clinic.    Dental Care: Organization         Address  Phone  Notes  [facility] [place] [phone] Accepts children up to age 22 who are enrolled in [place] or [place]; pregnant women with a Medicaid card; and children who have applied for Medicaid or [place] Health Choice, but were declined, whose parents can pay a reduced fee at time of service.  [facility] Department of [facility]  [address], [place] [phone] Accepts children up to age 48 who are enrolled in [place] or [place]; pregnant women with a Medicaid card; and children who have applied for Medicaid or [place] Health Choice, but were declined, whose parents can pay a reduced fee at time of service.  [place] Access PRO[facility]  [place] [phone] Patients are seen by appointment only. Walk-ins are not accepted. [place] will see patients 69 years of age and older. Monday - Tuesday (8am-5pm) Most Wednesdays (8:30-5pm) $30 per visit,  cash only  [facility] Access PRO[facility]  [address], [facility] [phone] Patients are seen by appointment only. Walk-ins are not accepted. [place] will see patients 67 years of age and older. One Wednesday Evening (Monthly: Volunteer Based).  $30 per visit, cash only  [place]  [phone] for adults; Children under age 63, call  Graduate Pediatric Dentistry at [phone]. Children aged 56-14, please call [phone] to request a pediatric application.  Dental services are provided in all areas of dental care including fillings, crowns and bridges, complete and partial dentures, implants, gum treatment, root canals, and extractions. Preventive care is also provided. Treatment is provided to both adults and children. Patients are selected via a lottery and there is often a waiting list.   [facility] [address] [place]  [phone] [email or web address]   [facility] [address] [place], [place] [phone], Ext. [phone] Second and Fourth Thursday of each month, opens at 6:30 AM; Clinic ends at 9 AM.  Patients are seen on a first-come first-served basis, and a limited number are seen during each clinic.   [facility]  [address] Hillard Danker [place], [place] [phone]   Eligibility Requirements You must have lived in [place], [place], or [place] counties for at least the last three months.   You cannot be eligible for state or federal sponsored [employer], including [employer], [place], or [employer].   You generally cannot be eligible for healthcare insurance through your employer.    How to apply: Eligibility screenings are held every Tuesday and Wednesday afternoon from 1:00 pm until 4:00 pm. You do not need an appointment for the interview!  [facility] [address], [place], [place]   [place]   [place] Department  [place]  [phone]    Behavioral Health Resources in the Community: Intensive Outpatient Programs Organization         Address  Phone  Notes  [facility] [place]. [address], [place], [place] [phone]   [facility] [address] [place], [place]   ADS: [facility] [address], [place], [place]   [facility] [address]  [place], [place] or [phone]   Substance Abuse Resources Organization         Address  Phone  Notes  Alcohol and Drug Services  [phone]   [place]  [phone]   [facility]   Chinita Pester  [phone]   Residential & Outpatient Substance Abuse Program  [phone]   Psychological Services Organization         Address  Phone  Notes  [facility] [place]  [place]  [phone]   [place] [address] [place] or [phone]    Mobile Crisis Teams Organization         Address  Phone  Notes  [facility], Mobile Crisis Care Unit  [phone]   [facility]  [address] [place] [address] [place], [place]   [place] Levels [address] [place] [phone]    Self-Help/Support Groups Organization         Address  Phone             Notes  [place] [place] - variety of support groups  [place] Call for more information  Narcotics Anonymous (NA), [facility] [address], [employer] [place]  2 meetings at this location   Special educational needs teacher         Address  Phone  Notes  [facility] [place],  [place]  [phone]   [facility]  [address] [place] [address], [place], [place] [phone]    [facility] [address] [place], [place] [phone] Admissions: 8am-3pm M-F  [facility] [address]    [place], [place] [phone]   [facility] [address] [place], [place], [place] [phone]   [facility] [address],  [place], [place] [phone]   [facility] - Intensive Outpatient [address], [place], [place] [phone]   [facility] ([facility] [address] [place].,  [place], [place] [phone] or [phone]   [facility] ([facility]) [address], [place], [place] [phone] Accepts Medicaid  [facility] [address]  [place] [phone] Substance Abuse/Addiction Treatment   [facility] Organization         Address  Phone  Notes  [facility]  [phone]   Angie Fava, PhD [address] Ervin Knack [place], [place]   [phone] or [phone]   [facility]   [address] [place], [place] [phone]   [facility] [address] [place], [place] [phone] Insurance/Medicaid/sponsorship through [facility] [address]                                    [place], [place] [phone] Therapy/tele-psych/case  [facility] [address], [place] [phone]    Dr. Lolly Mustache  [phone]   [facility]  [employer] [facility] Dept. 1) [address] [place] 2) [address], [place] 3)  [address], [place] [phone]  [phone]   [facility] Child Abuse Hotline [phone] or [phone] (After Hours)

## 2015-08-30 NOTE — ED Provider Notes (Signed)
CSN: [id number]     Arrival date & time 08/30/15  1726 History   First MD Initiated Contact with Patient 08/30/15 1806     Chief Complaint  Patient presents with  . urine drug screen    urine drug screen     (Consider location/radiation/quality/duration/timing/severity/associated sxs/prior Treatment) HPI   Blood pressure 124/81, pulse 122, temperature 98.5 F (36.9 C), temperature source Oral, resp. rate 20, last menstrual period 07/23/2015, SpO2 98 %.  Teasia Zapf is a 34 y.o. female requesting urine drug screen. Patient states that she's had low back pain earlier in the week, states that there have been no new injuries but the back pain has moved into the center of the upper back, described as burning. She states muscle relaxers and 800 mg Motrin at home with little relief. 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 Since that she has an appointment with a primary care physician tomorrow.  Past Medical History  Diagnosis Date  . PNA (pneumonia)    History reviewed. No pertinent past surgical history. History reviewed. No pertinent family history. Social History  Substance Use Topics  . Smoking status: Never Smoker   . Smokeless tobacco: Never Used  . Alcohol Use: No   OB History    No data available     Review of Systems  10 systems reviewed and found to be negative, except as noted in the HPI.   Allergies  Penicillins and Sulfa antibiotics  Home Medications   Prior to Admission medications   Medication Sig Start Date End Date Taking? Authorizing Provider  acetaminophen (TYLENOL) 500 MG tablet Take 1,000 mg by mouth every 6 (six) hours as needed for moderate pain.   Yes Historical Provider, MD  cyclobenzaprine (FLEXERIL) 10 MG tablet Take 1 tablet (10 mg total) by mouth 2 (two) times daily as  needed for muscle spasms. 08/26/15  Yes Shari Upstill, PA-C  guaiFENesin (ROBITUSSIN) 100 MG/5ML SOLN Take 15 mLs by mouth every 4 (four) hours as needed for cough or to loosen phlegm.   Yes Historical Provider, MD  ibuprofen (ADVIL,MOTRIN) 800 MG tablet Take 1 tablet (800 mg total) by mouth 3 (three) times daily. 08/26/15  Yes Shari Upstill, PA-C  azithromycin (ZITHROMAX) 250 MG tablet Take 1 tablet (250 mg total) by mouth daily. Take first 2 tablets together, then 1 every day until finished. Patient not taking: Reported on 10/08/2014 09/11/14   Kristen N Ward, DO  benzonatate (TESSALON) 100 MG capsule Take 2 capsules (200 mg total) by mouth 2 (two) times daily as needed for cough. 08/22/15   Barrett Henle, PA-C  cetirizine-pseudoephedrine (ZYRTEC-D) 5-120 MG tablet Take 1 tablet by mouth 2 (two) times daily. 08/22/15   Barrett Henle, PA-C  doxycycline (VIBRAMYCIN) 100 MG capsule Take 1 capsule (100 mg total) by mouth 2 (two) times daily. Patient not taking: Reported on 10/08/2014 09/16/14   Oswaldo Conroy, PA-C  HYDROcodone-homatropine [facility]) 5-1.5 MG/5ML syrup Take 5 mLs by mouth every 6 (six) hours as needed for cough. 10/08/14   Tatyana Kirichenko, PA-C  ondansetron (ZOFRAN ODT) 4 MG disintegrating tablet Take 1 tablet (4 mg total) by mouth every 8 (eight) hours as needed for nausea or vomiting. 08/22/15   Barrett Henle, PA-C  predniSONE (DELTASONE) 20 MG tablet Take 2 tablets (40  mg total) by mouth daily. 10/08/14   Tatyana Kirichenko, PA-C  promethazine (PHENERGAN) 25 MG tablet Take 1 tablet (25 mg total) by mouth every 6 (six) hours as needed for nausea or vomiting. 09/11/14   Kristen N Ward, DO   BP 124/81 mmHg  Pulse 122  Temp(Src) 98.5 F (36.9 C) (Oral)  Resp 20  SpO2 98%  LMP 07/23/2015 Physical Exam  Constitutional: She appears well-developed and well-nourished.  HENT:  Head: Normocephalic.  Eyes: Conjunctivae are normal. Pupils are equal, round,  and reactive to light.  Neck: Normal range of motion.  No midline C-spine  tenderness to palpation or step-offs appreciated. Patient has full range of motion without pain.  Grip strength, biceps, triceps 5/5 bilaterally;  can differentiate between pinprick and light touch bilaterally.   Cardiovascular: Regular rhythm and intact distal pulses.   Mild tachycardia  Pulmonary/Chest: Effort normal.  Abdominal: Soft. There is no tenderness.  Musculoskeletal: She exhibits tenderness.       Back:  Neurological: She is alert.  No point tenderness to percussion of lumbar spinal processes.  Mild diffuse lumbar paraspinal muscular spasm. Strength is 5 out of 5 to bilateral lower extremities at hip and knee; extensor hallucis longus 5 out of 5. Ankle strength 5 out of 5, no clonus, neurovascularly intact.  Patellar reflexes are 2+ bilaterally.    Ambulates with a coordinated in nonantalgic gait  Psychiatric:  Agitated, pt states she is upset due to issues at her work.   Nursing note and vitals reviewed.   ED Course  Procedures (including critical care time) Labs Review Labs Reviewed - No data to display  Imaging Review No results found. I have personally reviewed and evaluated these images and lab results as part of my medical decision-making.   EKG Interpretation None      MDM   Final diagnoses:  Musculoskeletal pain    Filed Vitals:   08/30/15 1739  BP: 124/81  Pulse: 122  Temp: 98.5 F (36.9 C)  TempSrc: Oral  Resp: 20  SpO2: 98%    Anicka Stuckert is 34 y.o. female requesting urine drug screen for workers comp evaluation. This patient is initially very agitated, states that this is secondary to issues at her work from the workers comp issue. Patient doesn't have any midline tenderness palpation. Initially I believe that her pain was in the low back but this has changed to the thoracic back. She describes it as a burning sensation, patient is exquisitely tender to  light palpation of the area as diagrammed. Neuro exam nonfocal. I've offered this patient x-rays however, I've advised her I don't think this is indicated on emergency basis because the odds of her having a broken bone are extremely slim with the mechanism of injury which is lifting. Patient states that she will follow with her primary care physician tomorrow. Doesn't appear that the Flexeril is helping her, I have changed her prescription to Robaxin and patient is given a work note for 2 more days.  Evaluation does not show pathology that would require ongoing emergent intervention or inpatient treatment. Pt is hemodynamically stable and mentating appropriately. Discussed findings and plan with patient/guardian, who agrees with care plan. All questions answered. Return precautions discussed and outpatient follow up given.   New Prescriptions   METHOCARBAMOL (ROBAXIN) 500 MG TABLET    Take 2 tablets (1,000 mg total) by mouth 4 (four) times daily as needed (Pain).        Wynetta Emery, PA-C 08/30/15  [id number]  Raeford RazorStephen Kohut, MD 09/02/15 2156

## 2015-09-02 ENCOUNTER — Encounter (HOSPITAL_COMMUNITY): Payer: Self-pay | Admitting: Family Medicine

## 2015-09-02 ENCOUNTER — Emergency Department (HOSPITAL_COMMUNITY)
Admission: EM | Admit: 2015-09-02 | Discharge: 2015-09-02 | Disposition: A | Discharge status: Home / Self Care | Payer: Worker's Compensation | Attending: Emergency Medicine | Admitting: Emergency Medicine

## 2015-09-02 ENCOUNTER — Emergency Department (HOSPITAL_COMMUNITY): Payer: Worker's Compensation

## 2015-09-02 DIAGNOSIS — M546 Pain in thoracic spine: Secondary | ICD-10-CM | POA: Insufficient documentation

## 2015-09-02 DIAGNOSIS — Z8701 Personal history of pneumonia (recurrent): Secondary | ICD-10-CM | POA: Insufficient documentation

## 2015-09-02 DIAGNOSIS — Z7952 Long term (current) use of systemic steroids: Secondary | ICD-10-CM | POA: Diagnosis not present

## 2015-09-02 DIAGNOSIS — Z880 Allergy status to penicillin: Secondary | ICD-10-CM | POA: Diagnosis not present

## 2015-09-02 DIAGNOSIS — M545 Low back pain: Secondary | ICD-10-CM | POA: Diagnosis not present

## 2015-09-02 DIAGNOSIS — F419 Anxiety disorder, unspecified: Secondary | ICD-10-CM | POA: Insufficient documentation

## 2015-09-02 DIAGNOSIS — R000 Tachycardia, unspecified: Secondary | ICD-10-CM | POA: Insufficient documentation

## 2015-09-02 DIAGNOSIS — Z79899 Other long term (current) drug therapy: Secondary | ICD-10-CM | POA: Diagnosis not present

## 2015-09-02 DIAGNOSIS — M549 Dorsalgia, unspecified: Secondary | ICD-10-CM

## 2015-09-02 MED ORDER — KETOROLAC TROMETHAMINE 60 MG/2ML IM SOLN
60.0000 mg | Freq: Once | INTRAMUSCULAR | Status: AC
  Administered 2015-09-02: 60 mg via INTRAMUSCULAR
  Filled 2015-09-02: qty 2

## 2015-09-02 NOTE — ED Notes (Signed)
Pt states she refuses the vitals because the BP cuff hurts too much

## 2015-09-02 NOTE — Discharge Instructions (Signed)
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  Continue your current medications as prescribed, drink plenty of fluids.  You can try heating pads at home to see if this helps your pain.     Back Pain, Adult Back pain is very common in adults.The cause of back pain is rarely dangerous and the pain often gets better over time.The cause of your back pain may not be known. Some common causes of back pain include: 1. Strain of the muscles or ligaments supporting the spine. 2. Wear and tear (degeneration) of the spinal disks. 3. Arthritis. 4. Direct injury to the back. For many people, back pain may return. Since back pain is rarely dangerous, most people can learn to manage this condition on their own. HOME CARE INSTRUCTIONS Watch your back pain for any changes. The following actions may help to lessen any discomfort you are feeling: 1. Remain active. It is stressful on your back to sit or stand in one place for long periods of time. Do not sit, drive, or stand in one place for more than 30 minutes at a time. Take short walks on even surfaces as soon as you are able.Try to increase the length of time you walk each day. 2. Exercise regularly as directed by your health care provider. Exercise helps your back heal faster. It also helps avoid future injury by keeping your muscles strong and flexible. 3. Do not stay in bed.Resting more than 1-2 days can delay your recovery. 4. Pay attention to your body when you bend and lift. The most comfortable positions are those that put less stress on your recovering back. Always use proper lifting techniques, including: 1. Bending your knees. 2. Keeping the load close to your body. 3. Avoiding twisting. 5. Find a comfortable position to sleep. Use a firm mattress and lie on your side with your knees slightly bent. If you lie on your back, put a pillow under your knees. 6. Avoid feeling anxious or stressed.Stress increases muscle tension  and can worsen back pain.It is important to recognize when you are anxious or stressed and learn ways to manage it, such as with exercise. 7. Take medicines only as directed by your health care provider. Over-the-counter medicines to reduce pain and inflammation are often the most helpful.Your health care provider may prescribe muscle relaxant drugs.These medicines help dull your pain so you can more quickly return to your normal activities and healthy exercise. 8. Apply ice to the injured area: 1. Put ice in a plastic bag. 2. Place a towel between your skin and the bag. 3. Leave the ice on for 20 minutes, 2-3 times a day for the first 2-3 days. After that, ice and heat may be alternated to reduce pain and spasms. 9. Maintain a healthy weight. Excess weight puts extra stress on your back and makes it difficult to maintain good posture. SEEK MEDICAL CARE IF: 1. You have pain that is not relieved with rest or medicine. 2. You have increasing pain going down into the legs or buttocks. 3. You have pain that does not improve in one week. 4. You have night pain. 5. You lose weight. 6. You have a fever or chills. SEEK IMMEDIATE MEDICAL CARE IF:  1. You develop new bowel or bladder control problems. 2. You have unusual weakness or numbness in your arms or legs. 3. You develop nausea or vomiting. 4. You develop abdominal pain. 5. You feel faint.   This information  is not intended to replace advice given to you by your health care provider. Make sure you discuss any questions you have with your health care provider.   Document Released: 10/09/2005 Document Revised: 10/30/2014 Document Reviewed: 02/10/2014 Elsevier Interactive Patient Education 2016 [employer]  Back Exercises The following exercises strengthen the muscles that help to support the back. They also help to keep the lower back flexible. Doing these exercises can help to prevent back pain or lessen existing pain. If you have back  pain or discomfort, try doing these exercises 2-3 times each day or as told by your health care provider. When the pain goes away, do them once each day, but increase the number of times that you repeat the steps for each exercise (do more repetitions). If you do not have back pain or discomfort, do these exercises once each day or as told by your health care provider. EXERCISES Single Knee to Chest Repeat these steps 3-5 times for each leg: 5. Lie on your back on a firm bed or the floor with your legs extended. 6. Bring one knee to your chest. Your other leg should stay extended and in contact with the floor. 7. Hold your knee in place by grabbing your knee or thigh. 8. Pull on your knee until you feel a gentle stretch in your lower back. 9. Hold the stretch for 10-30 seconds. 10. Slowly release and straighten your leg. Pelvic Tilt Repeat these steps 5-10 times: 10. Lie on your back on a firm bed or the floor with your legs extended. 11. Bend your knees so they are pointing toward the ceiling and your feet are flat on the floor. 12. Tighten your lower abdominal muscles to press your lower back against the floor. This motion will tilt your pelvis so your tailbone points up toward the ceiling instead of pointing to your feet or the floor. 13. With gentle tension and even breathing, hold this position for 5-10 seconds. Cat-Cow Repeat these steps until your lower back becomes more flexible: 7. Get into a hands-and-knees position on a firm surface. Keep your hands under your shoulders, and keep your knees under your hips. You may place padding under your knees for comfort. 8. Let your head hang down, and point your tailbone toward the floor so your lower back becomes rounded like the back of a cat. 9. Hold this position for 5 seconds. 10. Slowly lift your head and point your tailbone up toward the ceiling so your back forms a sagging arch like the back of a cow. 11. Hold this position for 5  seconds. Press-Ups Repeat these steps 5-10 times: 6. Lie on your abdomen (face-down) on the floor. 7. Place your palms near your head, about shoulder-width apart. 8. While you keep your back as relaxed as possible and keep your hips on the floor, slowly straighten your arms to raise the top half of your body and lift your shoulders. Do not use your back muscles to raise your upper torso. You may adjust the placement of your hands to make yourself more comfortable. 9. Hold this position for 5 seconds while you keep your back relaxed. 10. Slowly return to lying flat on the floor. Bridges Repeat these steps 10 times: 1. Lie on your back on a firm surface. 2. Bend your knees so they are pointing toward the ceiling and your feet are flat on the floor. 3. Tighten your buttocks muscles and lift your buttocks off of the floor until your waist  is at almost the same height as your knees. You should feel the muscles working in your buttocks and the back of your thighs. If you do not feel these muscles, slide your feet 1-2 inches farther away from your buttocks. 4. Hold this position for 3-5 seconds. 5. Slowly lower your hips to the starting position, and allow your buttocks muscles to relax completely. If this exercise is too easy, try doing it with your arms crossed over your chest. Abdominal Crunches Repeat these steps 5-10 times: 1. Lie on your back on a firm bed or the floor with your legs extended. 2. Bend your knees so they are pointing toward the ceiling and your feet are flat on the floor. 3. Cross your arms over your chest. 4. Tip your chin slightly toward your chest without bending your neck. 5. Tighten your abdominal muscles and slowly raise your trunk (torso) high enough to lift your shoulder blades a tiny bit off of the floor. Avoid raising your torso higher than that, because it can put too much stress on your low back and it does not help to strengthen your abdominal muscles. 6. Slowly  return to your starting position. Back Lifts Repeat these steps 5-10 times: 1. Lie on your abdomen (face-down) with your arms at your sides, and rest your forehead on the floor. 2. Tighten the muscles in your legs and your buttocks. 3. Slowly lift your chest off of the floor while you keep your hips pressed to the floor. Keep the back of your head in line with the curve in your back. Your eyes should be looking at the floor. 4. Hold this position for 3-5 seconds. 5. Slowly return to your starting position. SEEK MEDICAL CARE IF:  Your back pain or discomfort gets much worse when you do an exercise.  Your back pain or discomfort does not lessen within 2 hours after you exercise. If you have any of these problems, stop doing these exercises right away. Do not do them again unless your health care provider says that you can. SEEK IMMEDIATE MEDICAL CARE IF:  You develop sudden, severe back pain. If this happens, stop doing the exercises right away. Do not do them again unless your health care provider says that you can.   This information is not intended to replace advice given to you by your health care provider. Make sure you discuss any questions you have with your health care provider.   Document Released: 11/16/2004 Document Revised: 06/30/2015 Document Reviewed: 12/03/2014 Elsevier Interactive Patient Education [employer]

## 2015-09-02 NOTE — ED Provider Notes (Signed)
CSN: [id number]     Arrival date & time 09/02/15  0807 History   First MD Initiated Contact with Patient 09/02/15 [phone]     Chief Complaint  Patient presents with  . Back Pain     Patient is a 34 y.o. female presenting with back pain. The history is provided by the patient. No language interpreter was used.  Back Pain  Ms. Exline is a 34 year old woman that presents for evaluation of back pain. A week ago she was moving a clean bed and the next morning she awoke with diffuse back pain. The pain is located in her lower and upper back. It is described as a sharp and needle sticking sensation in midline back and on the sides. Pain is constant since it began and worsening despite taking Motrin and Robaxin at home. She denies any alleviating or worsening symptoms. She felt her left shoulder pop yesterday and states there is no pain there. She denies any fevers, chest pain, abdominal pain, numbness, weakness, incontinence, neck pain. Symptoms are severe, constant, worsening.   Past Medical History  Diagnosis Date  . PNA (pneumonia)    Past Surgical History  Procedure Laterality Date  . Hernia repair     History reviewed. No pertinent family history. Social History  Substance Use Topics  . Smoking status: Never Smoker   . Smokeless tobacco: Never Used  . Alcohol Use: No   OB History    No data available     Review of Systems  Musculoskeletal: Positive for back pain.  All other systems reviewed and are negative.     Allergies  Penicillins and Sulfa antibiotics  Home Medications   Prior to Admission medications   Medication Sig Start Date End Date Taking? Authorizing Provider  acetaminophen (TYLENOL) 500 MG tablet Take 1,000 mg by mouth every 6 (six) hours as needed for moderate pain.    Historical Provider, MD  azithromycin (ZITHROMAX) 250 MG tablet Take 1 tablet (250 mg total) by mouth daily. Take first 2 tablets together, then 1 every day until finished. Patient not taking:  Reported on 10/08/2014 09/11/14   Kristen N Ward, DO  benzonatate (TESSALON) 100 MG capsule Take 2 capsules (200 mg total) by mouth 2 (two) times daily as needed for cough. 08/22/15   Barrett Henle, PA-C  cetirizine-pseudoephedrine (ZYRTEC-D) 5-120 MG tablet Take 1 tablet by mouth 2 (two) times daily. 08/22/15   Barrett Henle, PA-C  doxycycline (VIBRAMYCIN) 100 MG capsule Take 1 capsule (100 mg total) by mouth 2 (two) times daily. Patient not taking: Reported on 10/08/2014 09/16/14   Oswaldo Conroy, PA-C  guaiFENesin (ROBITUSSIN) 100 MG/5ML SOLN Take 15 mLs by mouth every 4 (four) hours as needed for cough or to loosen phlegm.    Historical Provider, MD  HYDROcodone-homatropine (HYCODAN) 5-1.5 MG/5ML syrup Take 5 mLs by mouth every 6 (six) hours as needed for cough. 10/08/14   Tatyana Kirichenko, PA-C  ibuprofen (ADVIL,MOTRIN) 800 MG tablet Take 1 tablet (800 mg total) by mouth 3 (three) times daily. Patient taking differently: Take 800 mg by mouth every 8 (eight) hours as needed for moderate pain.  08/26/15   Elpidio Anis, PA-C  methocarbamol (ROBAXIN) 500 MG tablet Take 2 tablets (1,000 mg total) by mouth 4 (four) times daily as needed (Pain). 08/30/15   Nicole Pisciotta, PA-C  ondansetron (ZOFRAN ODT) 4 MG disintegrating tablet Take 1 tablet (4 mg total) by mouth every 8 (eight) hours as needed for nausea or vomiting.  08/22/15   Barrett HenleNicole Toris Laverdiere Nadeau, PA-C  predniSONE (DELTASONE) 20 MG tablet Take 2 tablets (40 mg total) by mouth daily. 10/08/14   Tatyana Kirichenko, PA-C  promethazine (PHENERGAN) 25 MG tablet Take 1 tablet (25 mg total) by mouth every 6 (six) hours as needed for nausea or vomiting. 09/11/14   Kristen N Ward, DO   BP 120/55 mmHg  Pulse 112  Temp(Src) 97.3 F (36.3 C) (Oral)  Resp 20  Ht 5\' 5"  (1.651 m)  Wt 209 lb (94.802 kg)  BMI 34.78 kg/m2  SpO2 96%  LMP 07/23/2015 Physical Exam  Constitutional: She is oriented to person, place, and time. She  appears well-developed and well-nourished.  HENT:  Head: Normocephalic and atraumatic.  Cardiovascular: Regular rhythm.   No murmur heard. Tachycardic  Pulmonary/Chest: Effort normal and breath sounds normal. No respiratory distress.  Abdominal: Soft. There is no tenderness. There is no rebound and no guarding.  Musculoskeletal: She exhibits no edema.  Diffuse thoracic and lumbar tenderness to palpation. Tenderness is midline as well as bilaterally. There is no C-spine tenderness to palpation.  Neurological: She is alert and oriented to person, place, and time.  5 out of 5 strength in bilateral upper extremities. 5 out of 5 strength in bilateral lower extremities, including hip flexion, plantar flexion, dorsiflexion at the ankle. Sensation to light touch intact throughout bilateral lower and upper extremities. No ankle clonus. Unable to elicit patellar reflexes bilaterally.  Skin: Skin is warm and dry.  Psychiatric:  Anxious  Nursing note and vitals reviewed.   ED Course  Procedures (including critical care time) Labs Review Labs Reviewed - No data to display  Imaging Review Mr Thoracic Spine Wo Contrast  09/02/2015  CLINICAL DATA:  Neck and left shoulder pain beginning yesterday. Low back pain. EXAM: MRI THORACIC AND LUMBAR SPINE WITHOUT CONTRAST TECHNIQUE: Multiplanar and multiecho pulse sequences of the thoracic and lumbar spine were obtained without intravenous contrast. COMPARISON:  Chest radiographs 08/22/2015 FINDINGS: MR THORACIC SPINE FINDINGS Vertebral alignment is normal. Vertebral body heights an intervertebral disc space heights are preserved. A 1.5 cm hemangioma is noted in the T11 vertebral body. The thoracic spinal cord is normal in caliber and signal. No thoracic disc herniation, spinal stenosis, or neural foraminal stenosis is identified. Very mild lower lumbar facet arthrosis is noted at T10-11 and T11-12. Trace bilateral pleural effusions are noted. MR LUMBAR SPINE  FINDINGS Vertebral alignment is normal. Vertebral body heights are preserved. Intervertebral discs are well hydrated and maintained in height. A 1.3 cm lesion in the L3 vertebral body likely represents a hemangioma. The conus medullaris is normal in signal and terminates at L1. Paraspinal soft tissues are unremarkable. No disc herniation, spinal stenosis, or neural foraminal stenosis is identified. The there is at most minimal facet arthrosis in the lower lumbar spine. IMPRESSION: 1. No disc herniation or stenosis in the thoracic or lumbar spine. 2. Minimal lower thoracic and lower lumbar facet arthrosis. 3. Trace bilateral pleural effusions. Electronically Signed   By: Sebastian AcheAllen  Grady M.D.   On: 09/02/2015 14:05   Mr Lumbar Spine Wo Contrast  09/02/2015  CLINICAL DATA:  Neck and left shoulder pain beginning yesterday. Low back pain. EXAM: MRI THORACIC AND LUMBAR SPINE WITHOUT CONTRAST TECHNIQUE: Multiplanar and multiecho pulse sequences of the thoracic and lumbar spine were obtained without intravenous contrast. COMPARISON:  Chest radiographs 08/22/2015 FINDINGS: MR THORACIC SPINE FINDINGS Vertebral alignment is normal. Vertebral body heights an intervertebral disc space heights are preserved. A 1.5 cm hemangioma  is noted in the T11 vertebral body. The thoracic spinal cord is normal in caliber and signal. No thoracic disc herniation, spinal stenosis, or neural foraminal stenosis is identified. Very mild lower lumbar facet arthrosis is noted at T10-11 and T11-12. Trace bilateral pleural effusions are noted. MR LUMBAR SPINE FINDINGS Vertebral alignment is normal. Vertebral body heights are preserved. Intervertebral discs are well hydrated and maintained in height. A 1.3 cm lesion in the L3 vertebral body likely represents a hemangioma. The conus medullaris is normal in signal and terminates at L1. Paraspinal soft tissues are unremarkable. No disc herniation, spinal stenosis, or neural foraminal stenosis is  identified. The there is at most minimal facet arthrosis in the lower lumbar spine. IMPRESSION: 1. No disc herniation or stenosis in the thoracic or lumbar spine. 2. Minimal lower thoracic and lower lumbar facet arthrosis. 3. Trace bilateral pleural effusions. Electronically Signed   By: Sebastian Ache M.D.   On: 09/02/2015 14:05   I have personally reviewed and evaluated these images and lab results as part of my medical decision-making.   EKG Interpretation None      MDM   Final diagnoses:  Back pain  Back pain    Pt here for evaluation of back pain after moving a mattress for work.  Pt with diffuse tenderness on exam, NVI.  Unable to elicit patellar reflexes - unclear if due to cooperation.    MRI without any acute abnormalities. Presentation is consistent with musculoskeletal back pain, presentation is not consistent with referred pain. Discussed with patient continued home care with ibuprofen, Robaxin, heating pads.   Tilden Fossa, MD 09/03/15 [phone]

## 2015-09-02 NOTE — ED Notes (Signed)
Patient reports she picked up a water bottle yesterday and developed left shoulder, neck pain. Also, patient is complaining of lower back pain. Pt reports last Thursday, she moved a bed and mattress while at work. [facility]

## 2016-09-22 IMAGING — MR MR THORACIC SPINE W/O CM
4 of 11 series · 19 of 48 positions shown · non-contrast
Comparison: Chest radiographs 08/22/2015

CLINICAL DATA: Neck and left shoulder pain beginning yesterday. Low
back pain.

EXAM:
MRI THORACIC AND LUMBAR SPINE WITHOUT CONTRAST
TECHNIQUE: Multiplanar and multiecho pulse sequences of the thoracic and lumbar
spine were obtained without intravenous contrast.

[Series 7: T2 · sagittal · 3.0mm · 0.64mm/px · 3 of 13 slices shown (1 of 4)]
[im 1/13]
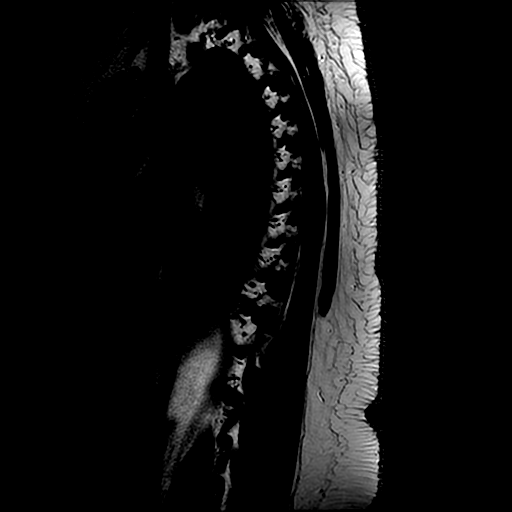
[im 7/13]
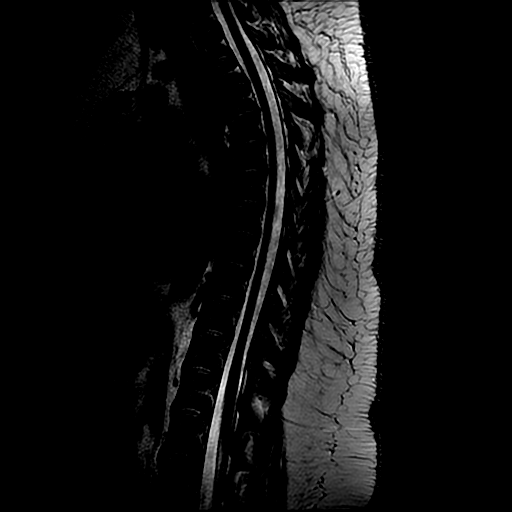
[im 13/13]
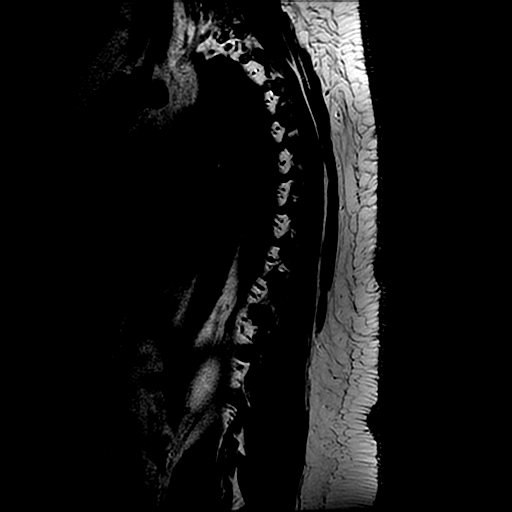

[Series 8: T2 · axial · 4.0mm · 0.39mm/px · z∈[-231,-25]mm · 9 of 44 slices shown (2 of 4)]
[im 1/44]
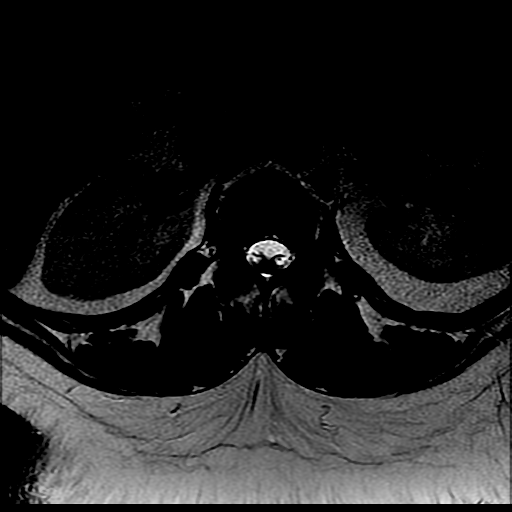
[im 6/44]
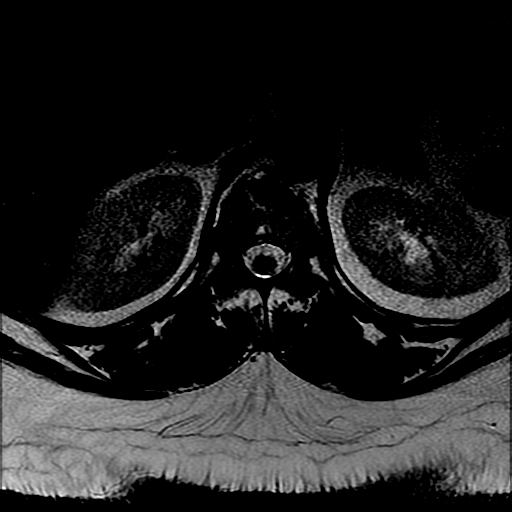
[im 11/44]
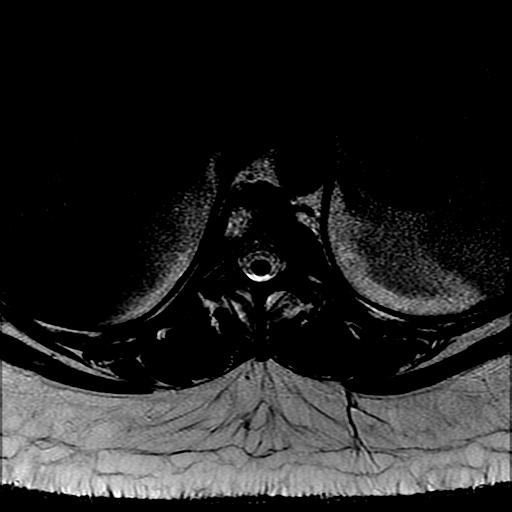
[im 17/44]
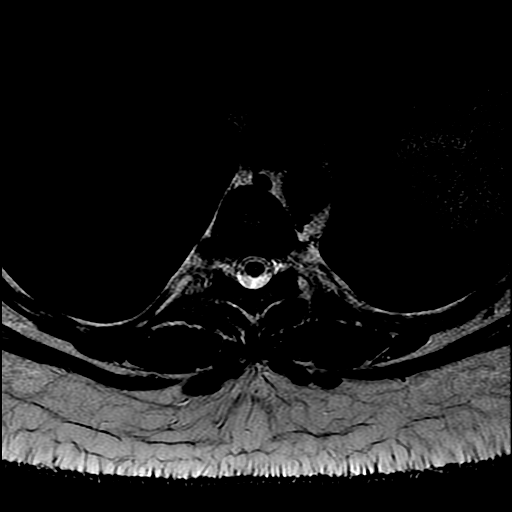
[im 22/44]
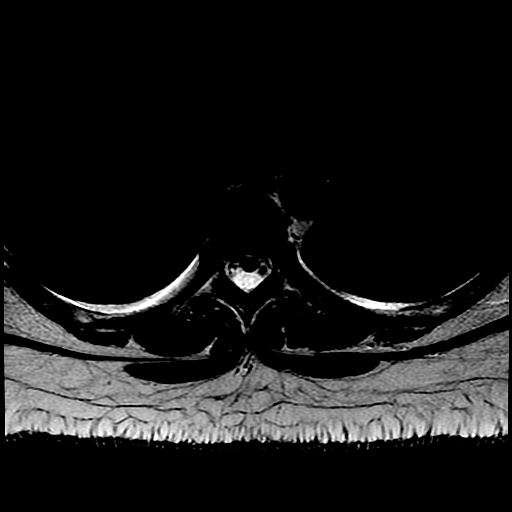
[im 27/44]
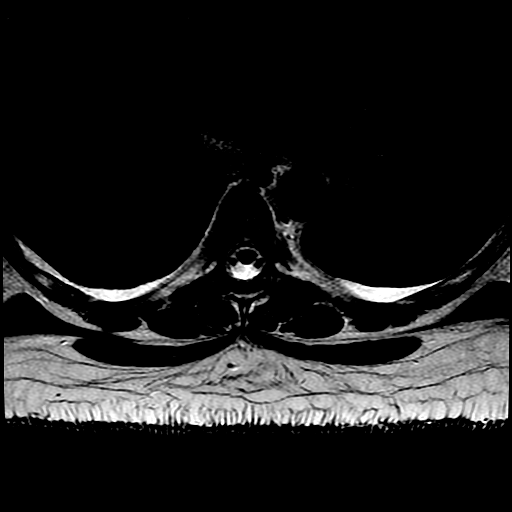
[im 33/44]
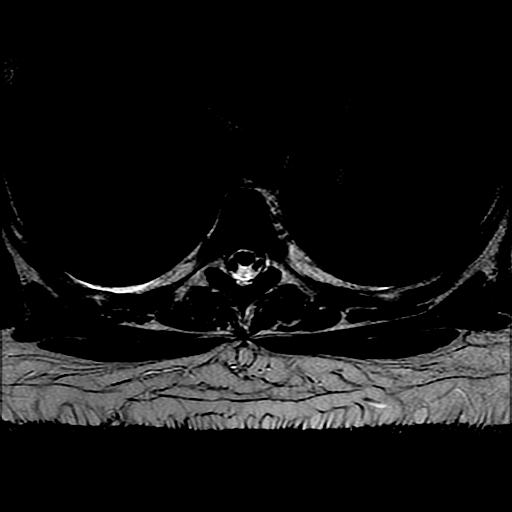
[im 38/44]
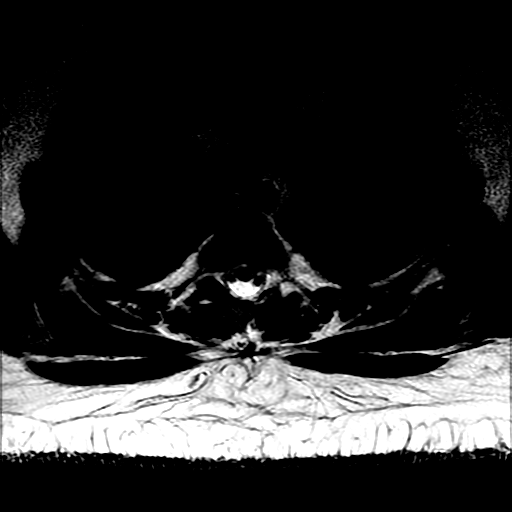
[im 44/44]
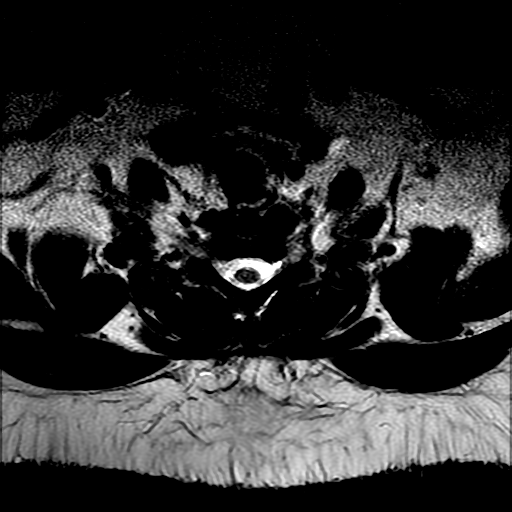

[Series 13: T2 · sagittal · 4.0mm · 0.55mm/px · 2 of 12 slices shown (3 of 4)]
[im 1/12]
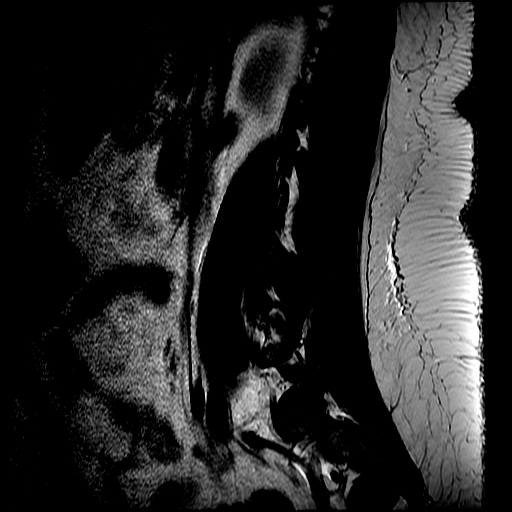
[im 12/12]
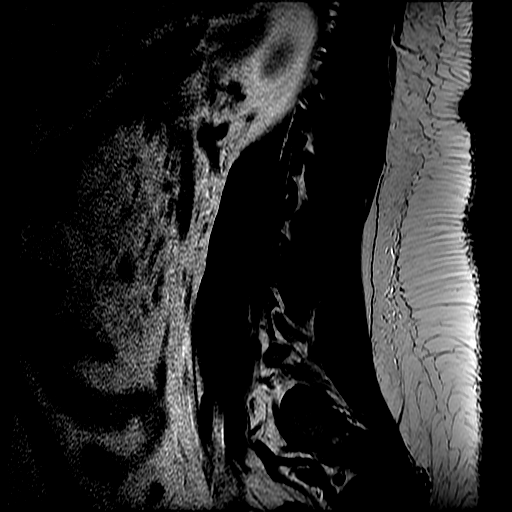

[Series 15: T2 · axial · 4.0mm · 0.39mm/px · z∈[-445,-295]mm · 5 of 33 slices shown (4 of 4)]
[im 1/33]
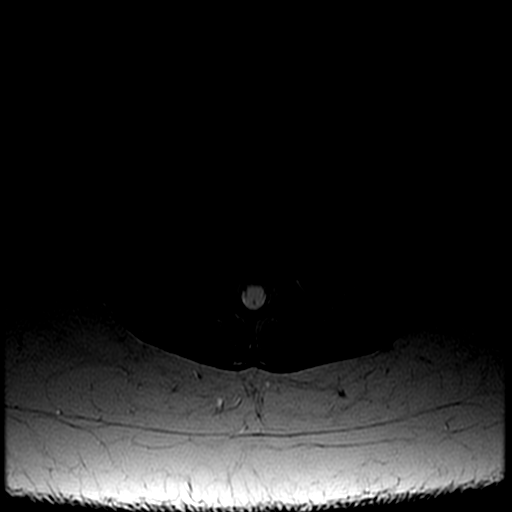
[im 6/33]
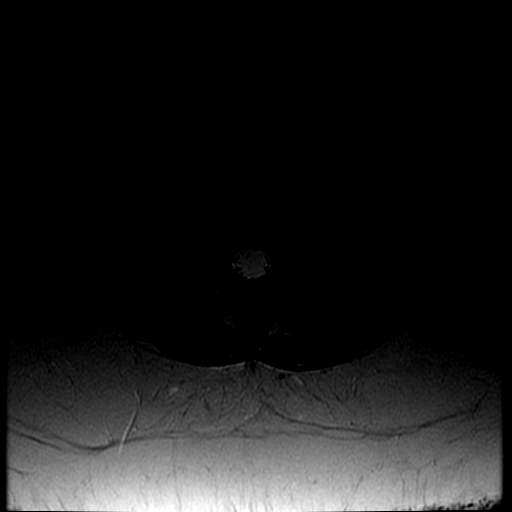
[im 11/33]
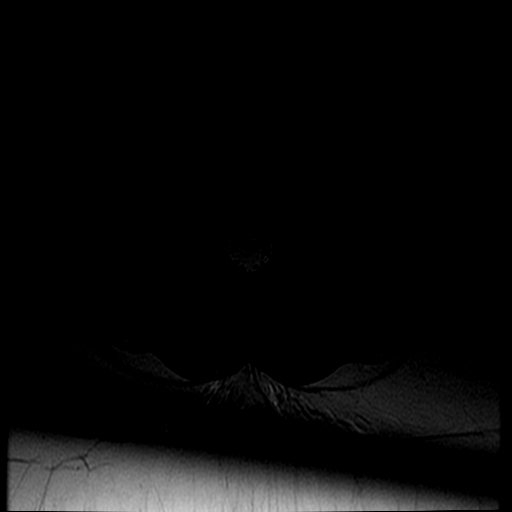
[im 17/33]
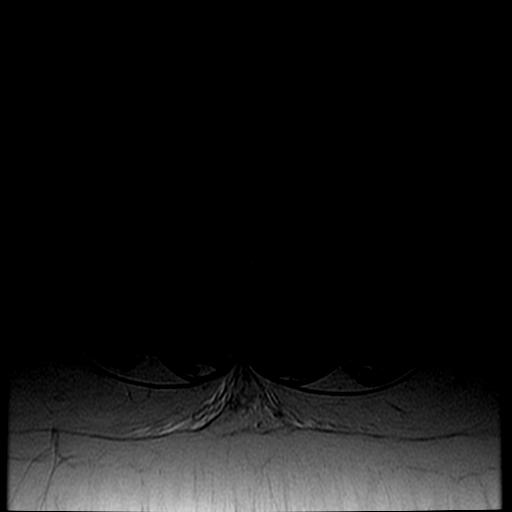
[im 27/33]
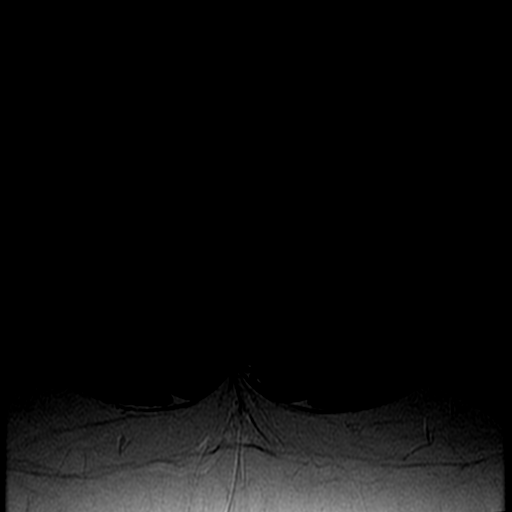

[19 of 48 positions shown; findings below may reference images not displayed]

FINDINGS: MR THORACIC SPINE FINDINGS

Vertebral alignment is normal. Vertebral body heights an
intervertebral disc space heights are preserved. A 1.5 cm hemangioma
is noted in the T11 vertebral body. The thoracic spinal cord is
normal in caliber and signal. No thoracic disc herniation, spinal
stenosis, or neural foraminal stenosis is identified. Very mild
lower lumbar facet arthrosis is noted at T10-11 and T11-12. Trace
bilateral pleural effusions are noted.

MR LUMBAR SPINE FINDINGS

Vertebral alignment is normal. Vertebral body heights are preserved.
Intervertebral discs are well hydrated and maintained in height. A
1.3 cm lesion in the L3 vertebral body likely represents a
hemangioma. The conus medullaris is normal in signal and terminates
at L1. Paraspinal soft tissues are unremarkable.

No disc herniation, spinal stenosis, or neural foraminal stenosis is
identified. The there is at most minimal facet arthrosis in the
lower lumbar spine.
IMPRESSION: 1. No disc herniation or stenosis in the thoracic or lumbar spine.
2. Minimal lower thoracic and lower lumbar facet arthrosis.
3. Trace bilateral pleural effusions.

## 2017-03-07 ENCOUNTER — Encounter: Payer: Self-pay | Admitting: Internal Medicine

## 2017-03-28 ENCOUNTER — Encounter (INDEPENDENT_AMBULATORY_CARE_PROVIDER_SITE_OTHER): Payer: Self-pay

## 2017-03-28 ENCOUNTER — Encounter: Payer: Self-pay | Admitting: Internal Medicine

## 2017-03-28 ENCOUNTER — Ambulatory Visit (INDEPENDENT_AMBULATORY_CARE_PROVIDER_SITE_OTHER): Payer: Medicaid Other | Admitting: Internal Medicine

## 2017-03-28 VITALS — BP 110/68 | HR 100 | Ht 64.25 in | Wt 225.0 lb

## 2017-03-28 DIAGNOSIS — K5900 Constipation, unspecified: Secondary | ICD-10-CM

## 2017-03-28 DIAGNOSIS — E119 Type 2 diabetes mellitus without complications: Secondary | ICD-10-CM

## 2017-03-28 DIAGNOSIS — K219 Gastro-esophageal reflux disease without esophagitis: Secondary | ICD-10-CM

## 2017-03-28 DIAGNOSIS — K625 Hemorrhage of anus and rectum: Secondary | ICD-10-CM

## 2017-03-28 DIAGNOSIS — K6289 Other specified diseases of anus and rectum: Secondary | ICD-10-CM

## 2017-03-28 MED ORDER — OMEPRAZOLE 40 MG PO CPDR: 40.0000 mg | DELAYED_RELEASE_CAPSULE | Freq: Every day | ORAL | 11 refills | Status: DC

## 2017-03-28 MED ORDER — NA SULFATE-K SULFATE-MG SULF 17.5-3.13-1.6 GM/177ML PO SOLN: 1.0000 | Freq: Once | ORAL | 0 refills | Status: AC

## 2017-03-28 NOTE — Patient Instructions (Signed)
  We have sent the following medications to your pharmacy for you to pick up at your convenience:  Omeprazole  Pick up the stool softener Colace over the counter and take it twice a day  You have been scheduled for a colonoscopy. Please follow written instructions given to you at your visit today.  Please pick up your prep supplies at the pharmacy within the next 1-3 days. If you use inhalers (even only as needed), please bring them with you on the day of your procedure. Your physician has requested that you go to www.startemmi.com and enter the access code given to you at your visit today. This web site gives a general overview about your procedure. However, you should still follow specific instructions given to you by our office regarding your preparation for the procedure.
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
## 2017-04-02 ENCOUNTER — Encounter: Payer: Self-pay | Admitting: Internal Medicine

## 2017-04-02 ENCOUNTER — Ambulatory Visit (AMBULATORY_SURGERY_CENTER): Payer: Medicaid Other | Admitting: Internal Medicine

## 2017-04-02 VITALS — BP 108/65 | HR 88 | Temp 97.8°F | Resp 17 | Ht 64.0 in | Wt 225.0 lb

## 2017-04-02 DIAGNOSIS — K625 Hemorrhage of anus and rectum: Secondary | ICD-10-CM

## 2017-04-02 DIAGNOSIS — K5730 Diverticulosis of large intestine without perforation or abscess without bleeding: Secondary | ICD-10-CM | POA: Diagnosis not present

## 2017-04-02 MED ORDER — SODIUM CHLORIDE 0.9 % IV SOLN: 500.0000 mL | INTRAVENOUS | Status: DC

## 2017-04-02 NOTE — Progress Notes (Signed)
No problems noted in the recovery room. maw 

## 2017-04-02 NOTE — Progress Notes (Signed)
Pt's states no medical or surgical changes since previsit or office visit. 

## 2017-04-02 NOTE — Progress Notes (Signed)
Report to PACU, RN, vss, BBS= Clear.  

## 2017-04-02 NOTE — Patient Instructions (Addendum)
YOU HAD AN ENDOSCOPIC PROCEDURE TODAY AT THE [facility]:   Refer to the procedure report that was given to you for any specific questions about what was found during the examination.  If the procedure report does not answer your questions, please call your gastroenterologist to clarify.  If you requested that your care partner not be given the details of your procedure findings, then the procedure report has been included in a sealed envelope for you to review at your convenience later.  YOU SHOULD EXPECT: Some feelings of bloating in the abdomen. Passage of more gas than usual.  Walking can help get rid of the air that was put into your GI tract during the procedure and reduce the bloating. If you had a lower endoscopy (such as a colonoscopy or flexible sigmoidoscopy) you may notice spotting of blood in your stool or on the toilet paper. If you underwent a bowel prep for your procedure, you may not have a normal bowel movement for a few days.  Please Note:  You might notice some irritation and congestion in your nose or some drainage.  This is from the oxygen used during your procedure.  There is no need for concern and it should clear up in a day or so.  SYMPTOMS TO REPORT IMMEDIATELY:   Following lower endoscopy (colonoscopy or flexible sigmoidoscopy):  Excessive amounts of blood in the stool  Significant tenderness or worsening of abdominal pains  Swelling of the abdomen that is new, acute  Fever of 100F or higher   For urgent or emergent issues, a gastroenterologist can be reached at any hour by calling [phone].   DIET:  We do recommend a small meal at first, but then you may proceed to your regular diet.  Drink plenty of fluids but you should avoid alcoholic beverages for 24 hours.  ACTIVITY:  You should plan to take it easy for the rest of today and you should NOT DRIVE or use heavy machinery until tomorrow (because of the sedation medicines used during the test).     FOLLOW UP: Our staff will call the number listed on your records the next business day following your procedure to check on you and address any questions or concerns that you may have regarding the information given to you following your procedure. If we do not reach you, we will leave a message.  However, if you are feeling well and you are not experiencing any problems, there is no need to return our call.  We will assume that you have returned to your regular daily activities without incident.  If any biopsies were taken you will be contacted by phone or by letter within the next 1-3 weeks.  Please call us at [phone] if you have not heard about the biopsies in 3 weeks.    SIGNATURES/CONFIDENTIALITY: You and/or your care partner have signed paperwork which will be entered into your electronic medical record.  These signatures attest to the fact that that the information above on your After Visit Summary has been reviewed and is understood.  Full responsibility of the confidentiality of this discharge information lies with you and/or your care-partner.    Handouts were given to your care partner on hemorrhoids, diverticulosis, and a high fiber diet with liberal fluid intake. You may resume your current medications today. Your blood sugar was 93 in the recovery room. Please call if any questions or concerns.

## 2017-04-02 NOTE — Op Note (Signed)
Anna Booth Patient Name: Anna Booth Procedure Date: 04/02/2017 3:02 PM MRN: [id number] Endoscopist: Wilhemina Bonito. Marina Goodell , MD Age: 36 Referring MD:  Date of Birth: 02-26-1981 Gender: Female Account #: [id number] Procedure:                Colonoscopy Indications:              Rectal bleeding Medicines:                Monitored Anesthesia Care Procedure:                Pre-Anesthesia Assessment:                           - Prior to the procedure, a History and Physical                            was performed, and patient medications and                            allergies were reviewed. The patient's tolerance of                            previous anesthesia was also reviewed. The risks                            and benefits of the procedure and the sedation                            options and risks were discussed with the patient.                            All questions were answered, and informed consent                            was obtained. Prior Anticoagulants: The patient has                            taken no previous anticoagulant or antiplatelet                            agents. ASA Grade Assessment: II - A patient with                            mild systemic disease. After reviewing the risks                            and benefits, the patient was deemed in                            satisfactory condition to undergo the procedure.                           After obtaining informed consent, the colonoscope  was passed under direct vision. Throughout the                            procedure, the patient's blood pressure, pulse, and                            oxygen saturations were monitored continuously. The                            Colonoscope was introduced through the anus and                            advanced to the the cecum, identified by                            appendiceal orifice and ileocecal valve. The                   ileocecal valve, appendiceal orifice, and rectum                            were photographed. The quality of the bowel                            preparation was excellent. The colonoscopy was                            performed without difficulty. The patient tolerated                            the procedure well. The bowel preparation used was                            SUPREP. Scope In: 3:14:48 PM Scope Out: 3:25:23 PM Scope Withdrawal Time: 0 hours 7 minutes 18 seconds  Total Procedure Duration: 0 hours 10 minutes 35 seconds  Findings:                 A few diverticula were found in the right colon.                           Internal hemorrhoids were found during                            retroflexion. The hemorrhoids were small.                           The exam was otherwise without abnormality on                            direct and retroflexion views. Complications:            No immediate complications. Estimated blood loss:                            None. Estimated Blood Loss:     Estimated blood loss: none.  Impression:               - Diverticulosis in the right colon.                           - Internal hemorrhoids.                           - The examination was otherwise normal on direct                            and retroflexion views.                           - No specimens collected. Recommendation:           - Repeat colonoscopy in 10 years for screening                            purposes.                           - Patient has a contact number available for                            emergencies. The signs and symptoms of potential                            delayed complications were discussed with the                            patient. Return to normal activities tomorrow.                            Written discharge instructions were provided to the                            patient.                           - Resume previous diet.                            - Continue present medications.                           - Try vigorous massage with warm washcloth when you                            develop problems with rectal spasm Anna N. Anna Bonito GoodellPerry, MD 04/02/2017 3:30:23 PM This report has been signed electronically.

## 2017-04-03 ENCOUNTER — Telehealth: Payer: Self-pay

## 2017-04-03 NOTE — Telephone Encounter (Signed)
  Follow up Call-  Call back number 04/02/2017  Post procedure Call Back phone  # [phone]  Permission to leave phone message Yes     Patient questions:  Do you have a fever, pain , or abdominal swelling? No. Pain Score  0 *  Have you tolerated food without any problems? Yes.    Have you been able to return to your normal activities? Yes.    Do you have any questions about your discharge instructions: Diet   No. Medications  No. Follow up visit  No.  Do you have questions or concerns about your Care? No.  Actions: * If pain score is 4 or above: No action needed, pain <4.  No problems noted per pt.  Pt wanted to thank everyone for her excellent care. maw

## 2017-04-27 ENCOUNTER — Encounter: Payer: Self-pay | Admitting: Internal Medicine

## 2017-08-08 ENCOUNTER — Other Ambulatory Visit: Payer: Self-pay | Admitting: Family Medicine

## 2017-08-08 DIAGNOSIS — Z1231 Encounter for screening mammogram for malignant neoplasm of breast: Secondary | ICD-10-CM

## 2017-09-26 ENCOUNTER — Ambulatory Visit: Payer: Self-pay

## 2017-10-22 ENCOUNTER — Other Ambulatory Visit: Payer: Self-pay | Admitting: Internal Medicine

## 2017-10-30 ENCOUNTER — Emergency Department (HOSPITAL_COMMUNITY)
Admission: EM | Admit: 2017-10-30 | Discharge: 2017-10-30 | Discharge status: Against Medical Advice | Payer: Medicaid Other | Attending: Emergency Medicine | Admitting: Emergency Medicine

## 2017-10-30 ENCOUNTER — Encounter (HOSPITAL_COMMUNITY): Payer: Self-pay | Admitting: Emergency Medicine

## 2017-10-30 ENCOUNTER — Emergency Department (HOSPITAL_COMMUNITY): Payer: Medicaid Other

## 2017-10-30 DIAGNOSIS — R05 Cough: Secondary | ICD-10-CM | POA: Diagnosis present

## 2017-10-30 DIAGNOSIS — Z5321 Procedure and treatment not carried out due to patient leaving prior to being seen by health care provider: Secondary | ICD-10-CM | POA: Diagnosis not present

## 2017-10-30 NOTE — ED Triage Notes (Signed)
Patient reports that she was flying on the 2nd and since she got home she had cough with clear sputum. Reports that she get chills and sweats.

## 2017-11-06 ENCOUNTER — Ambulatory Visit: Payer: Self-pay

## 2017-11-13 ENCOUNTER — Ambulatory Visit: Payer: Self-pay

## 2017-11-20 ENCOUNTER — Ambulatory Visit: Payer: Self-pay

## 2017-11-27 ENCOUNTER — Ambulatory Visit: Payer: Self-pay

## 2017-12-04 ENCOUNTER — Ambulatory Visit: Payer: Self-pay

## 2017-12-10 ENCOUNTER — Emergency Department (HOSPITAL_COMMUNITY)
Admission: EM | Admit: 2017-12-10 | Discharge: 2017-12-10 | Discharge status: Absent without leave | Payer: Medicaid Other

## 2017-12-10 ENCOUNTER — Emergency Department (HOSPITAL_COMMUNITY)
Admission: EM | Admit: 2017-12-10 | Discharge: 2017-12-10 | Disposition: A | Discharge status: Home / Self Care | Payer: Medicaid Other | Attending: Emergency Medicine | Admitting: Emergency Medicine

## 2017-12-10 DIAGNOSIS — R05 Cough: Secondary | ICD-10-CM | POA: Diagnosis not present

## 2017-12-10 DIAGNOSIS — Z5321 Procedure and treatment not carried out due to patient leaving prior to being seen by health care provider: Secondary | ICD-10-CM | POA: Insufficient documentation

## 2017-12-10 NOTE — ED Notes (Signed)
Per NT, patient reports she is leaving to go to her PCP.

## 2017-12-11 ENCOUNTER — Ambulatory Visit: Payer: Self-pay

## 2017-12-14 ENCOUNTER — Ambulatory Visit: Payer: Self-pay | Admitting: Registered"

## 2020-01-19 ENCOUNTER — Ambulatory Visit: Attending: Nurse Practitioner | Primary: Nurse Practitioner

## 2020-01-19 ENCOUNTER — Ambulatory Visit: Admit: 2020-01-19 | Discharge: 2020-01-19 | Attending: Nurse Practitioner | Primary: Nurse Practitioner

## 2020-01-19 DIAGNOSIS — Z7689 Persons encountering health services in other specified circumstances: Secondary | ICD-10-CM

## 2020-01-19 MED ORDER — CYCLOBENZAPRINE 10 MG TAB
10 mg | ORAL_TABLET | Freq: Three times a day (TID) | ORAL | 0 refills | Status: AC | PRN
Start: 2020-01-19 — End: 2020-01-29

## 2020-01-19 MED ORDER — KETOROLAC TROMETHAMINE 10 MG TAB
10 mg | ORAL_TABLET | Freq: Three times a day (TID) | ORAL | 0 refills | Status: AC | PRN
Start: 2020-01-19 — End: 2020-01-24

## 2020-01-19 NOTE — Patient Instructions (Signed)
Back Spasm: Care Instructions  Your Care Instructions  A back spasm is sudden tightness and pain in your back muscles. It may happen from overuse or an injury. Things like sleeping in an awkward way, bending, lifting, standing, or sitting can sometimes cause a spasm. But the cause isn't always clear.  Home treatment includes using heat or ice, taking over-the-counter (OTC) pain medicines, and avoiding activities that may cause back pain.  For a back spasm that doesn't get better with home care, your doctor may prescribe medicine. Treatments such as massage or manipulation may also help ease a back spasm. Your doctor may also suggest exercise or physical therapy to help improve strength and flexibility in your back muscles.  In most cases, getting back to your normal activities is good for your back. Just make sure to avoid doing things that make your pain worse.  Follow-up care is a key part of your treatment and safety. Be sure to make and go to all appointments, and call your doctor if you are having problems. It's also a good idea to know your test results and keep a list of the medicines you take.  How can you care for yourself at home?  Heat, ice, and medicines  ?? ?? To relieve pain, use heat or ice (whichever feels better) on the affected area.  ? Put a warm water bottle, a heating pad set on low, or a warm cloth on your back. Put a thin cloth between the heating pad and your skin. Do not go to sleep with a heating pad on your skin.  ? Try ice or a cold pack on the area for 10 to 20 minutes at a time. Put a thin cloth between the ice and your skin.   ?? ?? For most back pain you can take over-the-counter pain medicine. Nonsteroidal anti-inflammatory drugs (NSAIDs) such as ibuprofen or naproxen seem to work best. But if you can't take NSAIDs you can try acetaminophen. Your doctor can prescribe stronger medicines if needed. Be safe with medicines. Read and follow all instructions on the label.   Body positions  and posture  ?? ?? Sit or lie in positions that are most comfortable for you and that reduce pain. Try one of these positions when you lie down:  ? Lie on your back with your knees bent and supported by large pillows.  ? Lie on the floor with your legs on the seat of a sofa or chair.  ? Lie on your side with your knees and hips bent and a pillow between your legs.  ? Lie on your stomach if it does not make pain worse.   ?? ?? Do not sit up in bed. Avoid soft couches and twisted positions.   ?? ?? Avoid bed rest after the first day of back pain. Bed rest can help relieve pain at first, but it delays healing. Continued rest without activity is usually not good for your back.   ?? ?? If you must sit for long periods of time, take breaks from sitting. Change positions every 30 minutes. Get up and walk around, or lie in a comfortable position.   Activity  ?? ?? Take short walks several times a day. You can start with 5 to 10 minutes, 3 or 4 times a day, and work up to longer walks. Walk on level surfaces and avoid hills and stairs until your back starts to feel better.   ?? ?? After your back spasm starts to   feel better, try to stretch your muscles every day, especially before and after exercise and at bedtime. Regular stretching can help relax your muscles.   ?? ?? To prevent future back pain, do exercises to stretch and strengthen your back and stomach. Learn to use good posture, safe lifting techniques, and other ways to move to help you avoid back pain.   When should you call for help?   Call 911 anytime you think you may need emergency care. For example, call if:  ?? ?? You are unable to move an arm or a leg at all.   Call your doctor now or seek immediate medical care if:  ?? ?? You have new or worse symptoms in your legs, belly, or buttocks. Symptoms may include:  ? Numbness or tingling.  ? Weakness.  ? Pain.   ?? ?? You lose bladder or bowel control.   Watch closely for changes in your health, and be sure to contact your doctor if:   ?? ?? You have a fever, lose weight, or don't feel well.   ?? ?? You do not get better as expected.   Where can you learn more?  Go to https://www.healthwise.net/[facility]  Enter E232 in the search box to learn more about "Back Spasm: Care Instructions."  Current as of: December 23, 2018??????????????????????????????Content Version: 12.6  ?? 2006-2020 [employer].   Care instructions adapted under license by [employer] (which disclaims liability or warranty for this information). If you have questions about a medical condition or this instruction, always ask your healthcare professional. [employer] disclaims any warranty or liability for your use of this information.
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
## 2020-01-19 NOTE — Telephone Encounter (Signed)
At check out pt signed a release to send today's note to lawyers office.  Release is in records waiting for note to be completed.
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
## 2020-01-21 NOTE — Progress Notes (Signed)
Sign PT orders and faxed back to facility. See scanned media

## 2020-01-21 NOTE — Progress Notes (Signed)
Sign PT orders and faxed back to facility. See scanned media

## 2020-01-22 NOTE — Telephone Encounter (Signed)
Note has now been faxed

## 2020-02-09 NOTE — Telephone Encounter (Signed)
[facility] reached and informed to fax over last office note so AN can see why they need this ordered.

## 2020-02-09 NOTE — Telephone Encounter (Signed)
Physical therapy calling asking for an order for dry needle ing.     Fax [phone]

## 2020-02-10 NOTE — Telephone Encounter (Signed)
Documentation from [facility] signed and faxed back over to office.

## 2020-07-12 ENCOUNTER — Ambulatory Visit: Primary: Nurse Practitioner

## 2020-07-19 ENCOUNTER — Ambulatory Visit: Attending: Nurse Practitioner | Primary: Nurse Practitioner

## 2021-06-02 DIAGNOSIS — A6004 Herpesviral vulvovaginitis: Secondary | ICD-10-CM | POA: Insufficient documentation

## 2022-08-04 DIAGNOSIS — E1129 Type 2 diabetes mellitus with other diabetic kidney complication: Secondary | ICD-10-CM | POA: Insufficient documentation

## 2022-08-07 NOTE — Progress Notes (Signed)
Formatting of this note might be different from the original.  HPI  Grace Smith is a very pleasant 41 y.o. female who presents today for repeat evaluation of the right lateral forefoot.  I last saw the patient on 05/15/2022 for a right toe fracture.  She complains of continued intermittent pain and swelling to the small toe extending to the 5th metatarsal.  She comes in today to rule out any lack there of healing or new injury.   05/15/2022    Occupation: Medical record  Pertinent PMHx:  Noncontributory    RIGHT LOWER EXTREMITY  Female fusiform swelling to the 5th toe with overlying tenderness which reproduces the primary complaint. No gross deformity nor malrotation of the lesser toes.  Mild tenderness along the lateral metatarsal.  Intact cascade.  Sensation intact.  Pulse intact.    IMAGING  XR IMAGING RESULTXR foot 3+ views right      DOS: 08/07/2022  Three views weight-bearing right foot obtained today: Unremarkable    IMPRESSION  History of right foot with toe distal phalanx nondisplaced fracture with expected healing    PLAN  X-rays and clinical examination reviewed with the patient.  Reassurance provided.  Recommend symptomatic treatment.  The patient is to obtain Voltaren over-the-counter for topical application.  Activity modification.  Icing.  The patient will follow up as needed. All questions were answered on today's visit.   Electronically signed by Howie Ill, MD at 08/65/7846 11:48 AM EDT

## 2023-12-11 DIAGNOSIS — F332 Major depressive disorder, recurrent severe without psychotic features: Secondary | ICD-10-CM | POA: Insufficient documentation

## 2024-05-26 ENCOUNTER — Encounter (HOSPITAL_COMMUNITY): Payer: Self-pay

## 2024-05-26 ENCOUNTER — Other Ambulatory Visit: Payer: Self-pay

## 2024-05-26 ENCOUNTER — Emergency Department (HOSPITAL_COMMUNITY)

## 2024-05-26 ENCOUNTER — Emergency Department (HOSPITAL_COMMUNITY)
Admission: EM | Admit: 2024-05-26 | Discharge: 2024-05-26 | Disposition: A | Discharge status: Home / Self Care | Attending: Emergency Medicine | Admitting: Emergency Medicine

## 2024-05-26 DIAGNOSIS — E871 Hypo-osmolality and hyponatremia: Secondary | ICD-10-CM | POA: Diagnosis not present

## 2024-05-26 DIAGNOSIS — R000 Tachycardia, unspecified: Secondary | ICD-10-CM | POA: Diagnosis not present

## 2024-05-26 DIAGNOSIS — J40 Bronchitis, not specified as acute or chronic: Secondary | ICD-10-CM | POA: Insufficient documentation

## 2024-05-26 DIAGNOSIS — R059 Cough, unspecified: Secondary | ICD-10-CM | POA: Diagnosis present

## 2024-05-26 LAB — CBC
HCT: 36.3 % (ref 36.0–46.0)
Hemoglobin: 11.5 g/dL — ABNORMAL LOW (ref 12.0–15.0)
MCH: 27.8 pg (ref 26.0–34.0)
MCHC: 31.7 g/dL (ref 30.0–36.0)
MCV: 87.7 fL (ref 80.0–100.0)
Platelets: 367 K/uL (ref 150–400)
RBC: 4.14 MIL/uL (ref 3.87–5.11)
RDW: 12.5 % (ref 11.5–15.5)
WBC: 8 K/uL (ref 4.0–10.5)
nRBC: 0 % (ref 0.0–0.2)

## 2024-05-26 LAB — BASIC METABOLIC PANEL WITH GFR
Anion gap: 10 (ref 5–15)
BUN: 9 mg/dL (ref 6–20)
CO2: 20 mmol/L — ABNORMAL LOW (ref 22–32)
Calcium: 9.3 mg/dL (ref 8.9–10.3)
Chloride: 104 mmol/L (ref 98–111)
Creatinine, Ser: 0.72 mg/dL (ref 0.44–1.00)
GFR, Estimated: >60 mL/min (ref >60)
Glucose, Bld: 160 mg/dL — ABNORMAL HIGH (ref 70–99)
Potassium: 4.1 mmol/L (ref 3.5–5.1)
Sodium: 134 mmol/L — ABNORMAL LOW (ref 135–145)

## 2024-05-26 MED ORDER — KETOROLAC TROMETHAMINE 15 MG/ML IJ SOLN
15.0000 mg | Freq: Once | INTRAMUSCULAR | Status: AC
  Administered 2024-05-26: 15 mg via INTRAVENOUS
  Filled 2024-05-26: qty 1

## 2024-05-26 MED ORDER — SODIUM CHLORIDE 0.9 % IV BOLUS
500.0000 mL | Freq: Once | INTRAVENOUS | Status: AC
  Administered 2024-05-26: 500 mL via INTRAVENOUS

## 2024-05-26 NOTE — ED Notes (Signed)
 Pt transported to xray

## 2024-05-26 NOTE — ED Notes (Signed)
This RN reviewed discharge instructions with patient. She verbalized understanding and denied any further questions. PT well appearing upon discharge and denies pain. Pt ambulated with stable gait to exit. Pt endorses ride home.

## 2024-05-26 NOTE — ED Provider Notes (Signed)
 [facility] AT [facility] Provider Note   CSN: [id number] Arrival date & time: 05/26/24  0900     Patient presents with: No chief complaint on file.   Anna Booth is a 43 y.o. female.   HPI Patient presents with URI symptoms cough.  Has been on antibiotics and has had steroids.  Is now about 3 weeks of symptoms.  Has a cough with sputum.  States she aches all over.  Still has congestion.  Has some myalgias with pain in her neck and legs.  No fevers.  Does have fatigue.  States she felt like dying previously.    Prior to Admission medications   Medication Sig Start Date End Date Taking? Authorizing Provider  cyclobenzaprine  (FLEXERIL ) 10 MG tablet Take 10 mg by mouth 2 (two) times daily as needed for muscle spasms.  08/27/15   [provider]  LEVEMIR FLEXTOUCH 100 UNIT/ML Pen Inject 60 Units into the skin every evening. 03/13/17   [provider]  meloxicam (MOBIC) 15 MG tablet Take 1 tablet by mouth daily. 01/16/17   [provider]  omeprazole  (PRILOSEC) 40 MG capsule Take 1 capsule (40 mg total) by mouth daily. 10/22/17   Abran Norleen SAILOR, MD  pravastatin (PRAVACHOL) 20 MG tablet Take 1 tablet by mouth daily. 03/05/17   [provider]  sertraline (ZOLOFT) 50 MG tablet Take 1 tablet by mouth at bedtime. 02/07/17   [provider]  Turmeric 500 MG TABS Take 1 tablet by mouth daily.    [provider]  vitamin B-12 (CYANOCOBALAMIN) 50 MCG tablet Take 50 mcg by mouth daily.    [provider]    Allergies: Other, Penicillins, Sulfa antibiotics, and Azithromycin     Review of Systems  Updated Vital Signs BP 130/88 (BP Location: Right Arm)   Pulse 88   Temp 98.4 F (36.9 C)   Resp 18   SpO2 100%   Physical Exam Vitals reviewed.  HENT:     Mouth/Throat:     Comments: Mildly harsh voice. Cardiovascular:     Rate and Rhythm: Regular rhythm. Tachycardia present.  Pulmonary:      Breath sounds: No wheezing.  Musculoskeletal:        General: No tenderness.     Cervical back: Neck supple.  Skin:    General: Skin is warm.     Capillary Refill: Capillary refill takes less than 2 seconds.  Neurological:     Mental Status: She is alert and oriented to person, place, and time.     (all labs ordered are listed, but only abnormal results are displayed) Labs Reviewed  BASIC METABOLIC PANEL WITH GFR - Abnormal; Notable for the following components:      Result Value   Sodium 134 (*)    CO2 20 (*)    Glucose, Bld 160 (*)    All other components within normal limits  CBC - Abnormal; Notable for the following components:   Hemoglobin 11.5 (*)    All other components within normal limits    EKG: None  Radiology: DG Chest 2 View Result Date: 05/26/2024 CLINICAL DATA:  Cough. EXAM: CHEST - 2 VIEW COMPARISON:  10/30/2017 FINDINGS: The cardiac silhouette, mediastinal and hilar contours are normal. The lungs are clear. No infiltrates, edema effusions. The bony thorax is intact. IMPRESSION: No acute cardiopulmonary findings. Electronically Signed   By: MYRTIS Stammer M.D.   On: 05/26/2024 09:59     Procedures   Medications  Ordered in the ED  sodium chloride  0.9 % bolus 500 mL (0 mLs Intravenous Stopped 05/26/24 1141)  ketorolac  (TORADOL ) 15 MG/ML injection 15 mg (15 mg Intravenous Given 05/26/24 1022)                                    Medical Decision Making Amount and/or Complexity of Data Reviewed Labs: ordered. Radiology: ordered.  Risk Prescription drug management.   Patient with URI symptoms cough.  Reviewed discharge paperwork from previous visits.  Had been on steroids and also antibiotics.  Still feeling bad after around 3 weeks.  Differential diagnose include such as viral syndrome, but also pneumonia.  Will get basic blood work x-ray and give some symptomatic treatment.  Feeling some better after treatment.  X-ray reassuring and blood work reassuring.   Mild elevation in glucose but is diabetic.  Patient appears stable for discharge home.  Follow-up with PCP.  Likely residual bronchitis or irritation from previous pneumonia.        Final diagnoses:  Bronchitis    ED Discharge Orders     None          Patsey Lot, MD 05/26/24 1535
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
## 2024-05-26 NOTE — Discharge Instructions (Signed)
 The coughing and myalgias are likely just irritation leftover from the pneumonia.  No pneumonia showed up today.  Your sugar was a little elevated will need to be followed.  Follow-up with your PCP.

## 2024-07-24 ENCOUNTER — Ambulatory Visit: Admitting: Internal Medicine

## 2024-07-29 ENCOUNTER — Ambulatory Visit: Admitting: Internal Medicine

## 2024-07-29 ENCOUNTER — Encounter: Payer: Self-pay | Admitting: Internal Medicine

## 2024-07-29 VITALS — BP 122/82 | HR 94 | Temp 97.8°F | Ht 65.0 in | Wt 214.6 lb

## 2024-07-29 DIAGNOSIS — E1129 Type 2 diabetes mellitus with other diabetic kidney complication: Secondary | ICD-10-CM | POA: Diagnosis not present

## 2024-07-29 DIAGNOSIS — Z7985 Long-term (current) use of injectable non-insulin antidiabetic drugs: Secondary | ICD-10-CM | POA: Diagnosis not present

## 2024-07-29 DIAGNOSIS — Z13220 Encounter for screening for lipoid disorders: Secondary | ICD-10-CM | POA: Diagnosis not present

## 2024-07-29 DIAGNOSIS — F330 Major depressive disorder, recurrent, mild: Secondary | ICD-10-CM

## 2024-07-29 DIAGNOSIS — R5383 Other fatigue: Secondary | ICD-10-CM

## 2024-07-29 DIAGNOSIS — R809 Proteinuria, unspecified: Secondary | ICD-10-CM | POA: Diagnosis not present

## 2024-07-29 DIAGNOSIS — Z1329 Encounter for screening for other suspected endocrine disorder: Secondary | ICD-10-CM

## 2024-07-29 MED ORDER — CYCLOBENZAPRINE HCL 10 MG PO TABS: 10.0000 mg | ORAL_TABLET | Freq: Two times a day (BID) | ORAL | 0 refills | Status: AC | PRN

## 2024-07-29 NOTE — Progress Notes (Unsigned)
 [facility] [address] [place] [address] Dept: [phone] Dept Fax: [phone]  New Patient Office Visit  Subjective:   Anna Booth 08-15-1981 07/29/2024  No chief complaint on file.   HPI: Anna Booth presents today to establish care at [facility]. Introduced to Publishing rights manager role and practice setting.  All questions answered.  Concerns: See below    The following portions of the patient's history were reviewed and updated as appropriate: past medical history, past surgical history, family history, social history, allergies, medications, and problem list.   There are no active problems to display for this patient.  Past Medical History:  Diagnosis Date   Anxiety    Depression    Diabetes (HCC)    HLD (hyperlipidemia)    Hypertension    PNA (pneumonia)    Past Surgical History:  Procedure Laterality Date   UMBILICAL HERNIA REPAIR     Family History  Problem Relation Age of Onset   Hypertension Mother    Diabetes Father    Other Father        prediabeties   Alcohol abuse Maternal Grandmother    Heart failure Maternal Grandmother    Cancer Maternal Aunt        female    Current Outpatient Medications:    cyclobenzaprine  (FLEXERIL ) 10 MG tablet, Take 10 mg by mouth 2 (two) times daily as needed for muscle spasms. , Disp: , Rfl: 0   LEVEMIR FLEXTOUCH 100 UNIT/ML Pen, Inject 60 Units into the skin every evening., Disp: , Rfl: 4   meloxicam (MOBIC) 15 MG tablet, Take 1 tablet by mouth daily., Disp: , Rfl: 2   omeprazole  (PRILOSEC) 40 MG capsule, Take 1 capsule (40 mg total) by mouth daily., Disp: 30 capsule, Rfl: 11   pravastatin (PRAVACHOL) 20 MG tablet, Take 1 tablet by mouth daily., Disp: , Rfl: 4   sertraline (ZOLOFT) 50 MG tablet, Take 1 tablet by mouth at bedtime., Disp: , Rfl: 1   Turmeric 500 MG TABS, Take 1 tablet by mouth daily., Disp: ,  Rfl:    vitamin B-12 (CYANOCOBALAMIN) 50 MCG tablet, Take 50 mcg by mouth daily., Disp: , Rfl:   Current Facility-Administered Medications:    0.9 %  sodium chloride  infusion, 500 mL, Intravenous, Continuous, Abran Norleen SAILOR, MD Allergies  Allergen Reactions   Other Swelling    Swelling at site  * Red Ants *    Penicillins Hives    Has patient had a PCN reaction causing immediate rash, facial/tongue/throat swelling, SOB or lightheadedness with hypotension: Yes Has patient had a PCN reaction causing severe rash involving mucus membranes or skin necrosis: Yes Has patient had a PCN reaction that required hospitalization No Has patient had a PCN reaction occurring within the last 10 years: No If all of the above answers are NO, then may proceed with Cephalosporin use.    Sulfa Antibiotics Other (See Comments)    Stomach cramps & vomiting.   Azithromycin  Nausea And Vomiting    ROS: A complete ROS was performed with pertinent positives/negatives noted in the HPI. The remainder of the ROS are negative.   Objective:   There were no vitals filed for this visit.  GENERAL: Well-appearing, in NAD. Well nourished.  SKIN: Pink, warm and dry. No rash, lesion, ulceration, or ecchymoses.  NECK: Trachea midline. Full ROM w/o pain or tenderness. No lymphadenopathy.  RESPIRATORY: Chest wall symmetrical. Respirations even and non-labored. Breath  sounds clear to auscultation bilaterally.  CARDIAC: S1, S2 present, regular rate and rhythm. Peripheral pulses 2+ bilaterally.  EXTREMITIES: Without clubbing, cyanosis, or edema.  NEUROLOGIC: No motor or sensory deficits. Steady, even gait.  PSYCH/MENTAL STATUS: Alert, oriented x 3. Cooperative, appropriate mood and affect.   Health Maintenance Due  Topic Date Due   HEMOGLOBIN A1C  Never done   FOOT EXAM  Never done   OPHTHALMOLOGY EXAM  Never done   HIV Screening  Never done   Diabetic kidney evaluation - Urine ACR  Never done   Hepatitis C Screening   Never done   DTaP/Tdap/Td (1 - Tdap) Never done   Pneumococcal Vaccine (1 of 2 - PCV) Never done   Hepatitis B Vaccines 19-59 Average Risk (1 of 3 - 19+ 3-dose series) Never done   HPV VACCINES (1 - 3-dose SCDM series) Never done   Cervical Cancer Screening (HPV/Pap Cotest)  Never done   Mammogram  Never done   Influenza Vaccine  Never done   COVID-19 Vaccine (1 - 2024-25 season) Never done    No results found for any visits on 07/29/24.  Assessment & Plan:   There are no diagnoses linked to this encounter.  No orders of the defined types were placed in this encounter.  No orders of the defined types were placed in this encounter.   No follow-ups on file.   Rosina Senters, FNP

## 2024-07-30 LAB — COMPREHENSIVE METABOLIC PANEL WITH GFR
ALT: 12 U/L (ref 0–35)
AST: 14 U/L (ref 0–37)
Albumin: 4.6 g/dL (ref 3.5–5.2)
Alkaline Phosphatase: 55 U/L (ref 39–117)
BUN: 7 mg/dL (ref 6–23)
CO2: 24 meq/L (ref 19–32)
Calcium: 9.7 mg/dL (ref 8.4–10.5)
Chloride: 100 meq/L (ref 96–112)
Creatinine, Ser: 0.64 mg/dL (ref 0.40–1.20)
GFR: 108.87 mL/min (ref >60.00)
Glucose, Bld: 259 mg/dL — ABNORMAL HIGH (ref 70–99)
Potassium: 4.2 meq/L (ref 3.5–5.1)
Sodium: 136 meq/L (ref 135–145)
Total Bilirubin: 0.2 mg/dL (ref 0.2–1.2)
Total Protein: 8 g/dL (ref 6.0–8.3)

## 2024-07-30 LAB — CBC WITH DIFFERENTIAL/PLATELET
Basophils Absolute: 0 K/uL (ref 0.0–0.1)
Basophils Relative: 0.8 % (ref 0.0–3.0)
Eosinophils Absolute: 0.1 K/uL (ref 0.0–0.7)
Eosinophils Relative: 1.7 % (ref 0.0–5.0)
HCT: 38.5 % (ref 36.0–46.0)
Hemoglobin: 12.5 g/dL (ref 12.0–15.0)
Lymphocytes Relative: 40.1 % (ref 12.0–46.0)
Lymphs Abs: 2.4 K/uL (ref 0.7–4.0)
MCHC: 32.4 g/dL (ref 30.0–36.0)
MCV: 87 fl (ref 78.0–100.0)
Monocytes Absolute: 0.4 K/uL (ref 0.1–1.0)
Monocytes Relative: 6.4 % (ref 3.0–12.0)
Neutro Abs: 3 K/uL (ref 1.4–7.7)
Neutrophils Relative %: 51 % (ref 43.0–77.0)
Platelets: 348 K/uL (ref 150.0–400.0)
RBC: 4.43 Mil/uL (ref 3.87–5.11)
RDW: 12.9 % (ref 11.5–15.5)
WBC: 5.9 K/uL (ref 4.0–10.5)

## 2024-07-30 LAB — LIPID PANEL
Cholesterol: 164 mg/dL (ref 0–200)
HDL: 48.1 mg/dL (ref >39.00)
LDL Cholesterol: 73 mg/dL (ref 0–99)
NonHDL: 115.47
Total CHOL/HDL Ratio: 3
Triglycerides: 210 mg/dL — ABNORMAL HIGH (ref 0.0–149.0)
VLDL: 42 mg/dL — ABNORMAL HIGH (ref 0.0–40.0)

## 2024-07-30 LAB — TSH RFX ON ABNORMAL TO FREE T4: TSH: 2.47 u[IU]/mL (ref 0.450–4.500)

## 2024-07-30 LAB — HEMOGLOBIN A1C: Hgb A1c MFr Bld: 8.2 % — ABNORMAL HIGH (ref 4.6–6.5)

## 2024-07-30 LAB — VITAMIN D 25 HYDROXY (VIT D DEFICIENCY, FRACTURES): VITD: 30.87 ng/mL (ref 30.00–100.00)

## 2024-07-30 LAB — VITAMIN B12: Vitamin B-12: >1500 pg/mL — ABNORMAL HIGH (ref 211–911)

## 2024-08-06 ENCOUNTER — Ambulatory Visit: Payer: Self-pay | Admitting: Internal Medicine

## 2024-08-06 DIAGNOSIS — E1129 Type 2 diabetes mellitus with other diabetic kidney complication: Secondary | ICD-10-CM

## 2024-08-06 NOTE — Telephone Encounter (Signed)
 Patient has been notified directly; all questions, if any, were answered. Patient voiced understanding of  lab results

## 2024-08-07 MED ORDER — TRULICITY 1.5 MG/0.5ML ~~LOC~~ SOAJ: 1.5000 mg | SUBCUTANEOUS | 2 refills | Status: AC

## 2024-08-08 ENCOUNTER — Encounter: Payer: Self-pay | Admitting: Internal Medicine
